# Patient Record
Sex: Female | Born: 1980 | Race: Black or African American | Hispanic: No | Marital: Single | Smoking: Former smoker
Health system: Southern US, Community
[De-identification: ages and names within clinical notes are randomized; demographics above are authoritative.]

## PROBLEM LIST (undated history)

## (undated) ENCOUNTER — Inpatient Hospital Stay (HOSPITAL_COMMUNITY): Payer: Self-pay

## (undated) DIAGNOSIS — B373 Candidiasis of vulva and vagina: Secondary | ICD-10-CM

## (undated) DIAGNOSIS — B9689 Other specified bacterial agents as the cause of diseases classified elsewhere: Secondary | ICD-10-CM

## (undated) DIAGNOSIS — N760 Acute vaginitis: Secondary | ICD-10-CM

## (undated) DIAGNOSIS — M797 Fibromyalgia: Secondary | ICD-10-CM

## (undated) DIAGNOSIS — B3731 Acute candidiasis of vulva and vagina: Secondary | ICD-10-CM

## (undated) DIAGNOSIS — E876 Hypokalemia: Secondary | ICD-10-CM

## (undated) DIAGNOSIS — D649 Anemia, unspecified: Secondary | ICD-10-CM

## (undated) DIAGNOSIS — O24419 Gestational diabetes mellitus in pregnancy, unspecified control: Secondary | ICD-10-CM

## (undated) DIAGNOSIS — N390 Urinary tract infection, site not specified: Secondary | ICD-10-CM

## (undated) DIAGNOSIS — I10 Essential (primary) hypertension: Secondary | ICD-10-CM

## (undated) HISTORY — DX: Fibromyalgia: M79.7

## (undated) HISTORY — DX: Essential (primary) hypertension: I10

## (undated) HISTORY — PX: TONSILECTOMY, ADENOIDECTOMY, BILATERAL MYRINGOTOMY AND TUBES: SHX2538

## (undated) HISTORY — PX: CHOLECYSTECTOMY: SHX55

## (undated) HISTORY — DX: Anemia, unspecified: D64.9

---

## 2009-02-08 ENCOUNTER — Ambulatory Visit: Payer: Self-pay | Admitting: Advanced Practice Midwife

## 2009-02-08 ENCOUNTER — Inpatient Hospital Stay (HOSPITAL_COMMUNITY): Admission: AD | Admit: 2009-02-08 | Discharge: 2009-02-08 | Payer: Self-pay | Admitting: Family Medicine

## 2009-02-18 ENCOUNTER — Ambulatory Visit (HOSPITAL_COMMUNITY): Admission: RE | Admit: 2009-02-18 | Discharge: 2009-02-18 | Payer: Self-pay | Admitting: Family Medicine

## 2009-03-04 ENCOUNTER — Ambulatory Visit (HOSPITAL_COMMUNITY): Admission: RE | Admit: 2009-03-04 | Discharge: 2009-03-04 | Payer: Self-pay | Admitting: Obstetrics & Gynecology

## 2009-03-15 ENCOUNTER — Inpatient Hospital Stay (HOSPITAL_COMMUNITY): Admission: AD | Admit: 2009-03-15 | Discharge: 2009-03-15 | Payer: Self-pay | Admitting: Family Medicine

## 2009-03-19 ENCOUNTER — Inpatient Hospital Stay (HOSPITAL_COMMUNITY): Admission: AD | Admit: 2009-03-19 | Discharge: 2009-03-19 | Payer: Self-pay | Admitting: Obstetrics & Gynecology

## 2009-03-22 ENCOUNTER — Inpatient Hospital Stay (HOSPITAL_COMMUNITY): Admission: AD | Admit: 2009-03-22 | Discharge: 2009-03-22 | Payer: Self-pay | Admitting: Obstetrics & Gynecology

## 2009-03-23 ENCOUNTER — Ambulatory Visit: Payer: Self-pay | Admitting: Family Medicine

## 2009-03-23 ENCOUNTER — Inpatient Hospital Stay (HOSPITAL_COMMUNITY): Admission: AD | Admit: 2009-03-23 | Discharge: 2009-03-25 | Payer: Self-pay | Admitting: Obstetrics and Gynecology

## 2009-03-28 ENCOUNTER — Inpatient Hospital Stay (HOSPITAL_COMMUNITY): Admission: AD | Admit: 2009-03-28 | Discharge: 2009-03-28 | Payer: Self-pay | Admitting: Obstetrics & Gynecology

## 2009-03-28 ENCOUNTER — Ambulatory Visit: Payer: Self-pay | Admitting: Vascular Surgery

## 2009-03-28 ENCOUNTER — Encounter: Payer: Self-pay | Admitting: Family Medicine

## 2009-08-26 ENCOUNTER — Emergency Department (HOSPITAL_COMMUNITY): Admission: EM | Admit: 2009-08-26 | Discharge: 2009-08-26 | Payer: Self-pay | Admitting: Family Medicine

## 2009-12-02 ENCOUNTER — Emergency Department (HOSPITAL_COMMUNITY): Admission: EM | Admit: 2009-12-02 | Discharge: 2009-12-02 | Payer: Self-pay | Admitting: Family Medicine

## 2009-12-17 ENCOUNTER — Emergency Department (HOSPITAL_BASED_OUTPATIENT_CLINIC_OR_DEPARTMENT_OTHER): Admission: EM | Admit: 2009-12-17 | Discharge: 2009-12-17 | Payer: Self-pay | Admitting: Emergency Medicine

## 2010-06-01 ENCOUNTER — Emergency Department (HOSPITAL_COMMUNITY)
Admission: EM | Admit: 2010-06-01 | Discharge: 2010-06-01 | Payer: Self-pay | Source: Home / Self Care | Admitting: Emergency Medicine

## 2010-06-02 LAB — POCT URINALYSIS DIPSTICK
Hgb urine dipstick: NEGATIVE
Ketones, ur: NEGATIVE mg/dL
Nitrite: NEGATIVE
Protein, ur: NEGATIVE mg/dL
Urobilinogen, UA: 1 mg/dL (ref 0.0–1.0)

## 2010-06-02 LAB — URINALYSIS, MICROSCOPIC ONLY
Ketones, ur: NEGATIVE mg/dL
Protein, ur: NEGATIVE mg/dL
Urine Glucose, Fasting: NEGATIVE mg/dL
Urobilinogen, UA: 1 mg/dL (ref 0.0–1.0)

## 2010-06-02 LAB — POCT PREGNANCY, URINE: Preg Test, Ur: NEGATIVE

## 2010-06-03 LAB — URINE CULTURE: Colony Count: NO GROWTH

## 2010-07-09 ENCOUNTER — Inpatient Hospital Stay (INDEPENDENT_AMBULATORY_CARE_PROVIDER_SITE_OTHER)
Admission: RE | Admit: 2010-07-09 | Discharge: 2010-07-09 | Disposition: A | Discharge status: Home / Self Care | Payer: Self-pay | Source: Ambulatory Visit | Attending: Family Medicine | Admitting: Family Medicine

## 2010-07-09 DIAGNOSIS — M549 Dorsalgia, unspecified: Secondary | ICD-10-CM

## 2010-07-09 DIAGNOSIS — N949 Unspecified condition associated with female genital organs and menstrual cycle: Secondary | ICD-10-CM

## 2010-07-09 LAB — POCT PREGNANCY, URINE: Preg Test, Ur: NEGATIVE

## 2010-07-09 LAB — POCT URINALYSIS DIPSTICK
Nitrite: NEGATIVE
Specific Gravity, Urine: 1.025 (ref 1.005–1.030)

## 2010-07-09 LAB — WET PREP, GENITAL
Trich, Wet Prep: NONE SEEN
WBC, Wet Prep HPF POC: NONE SEEN

## 2010-08-12 LAB — CBC
HCT: 24.9 % — ABNORMAL LOW (ref 36.0–46.0)
HCT: 31.4 % — ABNORMAL LOW (ref 36.0–46.0)
Hemoglobin: 8.3 g/dL — ABNORMAL LOW (ref 12.0–15.0)
Hemoglobin: 10.4 g/dL — ABNORMAL LOW (ref 12.0–15.0)
MCHC: 33.2 g/dL (ref 30.0–36.0)
MCHC: 33.3 g/dL (ref 30.0–36.0)
MCV: 81.3 fL (ref 78.0–100.0)
MCV: 81.1 fL (ref 78.0–100.0)
Platelets: 224 10*3/uL (ref 150–400)
Platelets: 244 10*3/uL (ref 150–400)
RBC: 3.87 MIL/uL (ref 3.87–5.11)
RDW: 14 % (ref 11.5–15.5)
RDW: 13.9 % (ref 11.5–15.5)
WBC: 5.6 10*3/uL (ref 4.0–10.5)

## 2010-08-12 LAB — DIFFERENTIAL
Basophils Absolute: 0 10*3/uL (ref 0.0–0.1)
Basophils Relative: 0 % (ref 0–1)
Lymphocytes Relative: 23 % (ref 12–46)
Monocytes Absolute: 0.5 10*3/uL (ref 0.1–1.0)
Neutro Abs: 3.7 10*3/uL (ref 1.7–7.7)
Neutrophils Relative %: 67 % (ref 43–77)

## 2010-08-12 LAB — RAPID HIV SCREEN (WH-MAU): Rapid HIV Screen: NONREACTIVE

## 2010-08-12 LAB — TYPE AND SCREEN

## 2010-09-23 ENCOUNTER — Inpatient Hospital Stay (INDEPENDENT_AMBULATORY_CARE_PROVIDER_SITE_OTHER)
Admission: RE | Admit: 2010-09-23 | Discharge: 2010-09-23 | Disposition: A | Discharge status: Home / Self Care | Payer: Self-pay | Source: Ambulatory Visit | Attending: Family Medicine | Admitting: Family Medicine

## 2010-09-23 ENCOUNTER — Ambulatory Visit (INDEPENDENT_AMBULATORY_CARE_PROVIDER_SITE_OTHER): Payer: Self-pay

## 2010-09-23 DIAGNOSIS — W19XXXA Unspecified fall, initial encounter: Secondary | ICD-10-CM

## 2010-09-23 DIAGNOSIS — IMO0002 Reserved for concepts with insufficient information to code with codable children: Secondary | ICD-10-CM

## 2010-09-23 LAB — POCT URINALYSIS DIP (DEVICE)
Nitrite: NEGATIVE
Protein, ur: NEGATIVE mg/dL
Urobilinogen, UA: 0.2 mg/dL (ref 0.0–1.0)

## 2010-09-23 LAB — GLUCOSE, CAPILLARY: Glucose-Capillary: 78 mg/dL (ref 70–99)

## 2010-09-23 LAB — POCT PREGNANCY, URINE: Preg Test, Ur: NEGATIVE

## 2010-10-19 ENCOUNTER — Encounter: Payer: Self-pay | Admitting: Family Medicine

## 2010-10-19 ENCOUNTER — Ambulatory Visit (INDEPENDENT_AMBULATORY_CARE_PROVIDER_SITE_OTHER): Payer: Self-pay | Admitting: Family Medicine

## 2010-10-19 DIAGNOSIS — N926 Irregular menstruation, unspecified: Secondary | ICD-10-CM | POA: Insufficient documentation

## 2010-10-19 DIAGNOSIS — G4700 Insomnia, unspecified: Secondary | ICD-10-CM

## 2010-10-19 DIAGNOSIS — R5383 Other fatigue: Secondary | ICD-10-CM | POA: Insufficient documentation

## 2010-10-19 DIAGNOSIS — E669 Obesity, unspecified: Secondary | ICD-10-CM | POA: Insufficient documentation

## 2010-10-19 LAB — CBC
MCV: 72.7 fL — ABNORMAL LOW (ref 78.0–100.0)
Platelets: 326 10*3/uL (ref 150–400)
RBC: 4.69 MIL/uL (ref 3.87–5.11)
RDW: 16.5 % — ABNORMAL HIGH (ref 11.5–15.5)
WBC: 6.8 10*3/uL (ref 4.0–10.5)

## 2010-10-19 LAB — TSH: TSH: 2.055 u[IU]/mL (ref 0.350–4.500)

## 2010-10-19 MED ORDER — ZOLPIDEM TARTRATE 10 MG PO TABS: 10.0000 mg | ORAL_TABLET | Freq: Every evening | ORAL | Status: DC | PRN

## 2010-10-19 NOTE — Assessment & Plan Note (Signed)
I am concerned for PCOS, especially considering pt's weight gain.  For now will make sure there is no underlying cause of her weight gain.

## 2010-10-19 NOTE — Assessment & Plan Note (Signed)
Pt obese and with consistent weight gain for past year.  I am concerned for an underlying cause, will check TSH.  Did encourage exercise to help with both weight management and sleep difficulty.

## 2010-10-19 NOTE — Assessment & Plan Note (Signed)
Will check CBC as well as TSH as pt. Has significant feelings of fatigue.

## 2010-10-19 NOTE — Progress Notes (Signed)
  Subjective:    Patient ID: JUDIA ARNOTT, female    DOB: 11-25-1980, 30 y.o.   MRN: [id number]  HPI Ms. Stockton presents to establish care.  She reports she has concerns about weight gain & fatigue over the past year, irregular menses, and difficulty sleeping.  She has no major medical problems except a history of anemia during pregnancy and gestational diabetes (diet controlled) during one of her three pregnancies.   She says over the past year she has gained about 40 lbs and she does not know why.  She is eating right, and was exercising regularly until last winter, but she has continued to gain weight.    She says that she takes care of her three children and for several months has felt constantly tired through the day.  She says she puts the children to bed by 8pm, watches 2 hours of tv, and then goes to sleep, but cannot fall asleep after feeling exhausted all day.  She has a regular bed time routine and does not fall asleep with the TV on.  Amelda has had irregular menses over the past few months.  She will miss a period, and then it will start again two months later.  She has had spotting in between but no excessive  Bleeding.  She is not currently sexually active.    Review of Systems Pertinent items noted in HPI.     Objective:   Physical Exam BP 125/90  Pulse 85  Temp 98.7 F (37.1 C)  Ht 5' 3.5" (1.613 m)  Wt 251 lb (113.853 kg)  BMI 43.77 kg/m2  LMP 09/23/2010 General appearance: alert, cooperative, no distress and moderately obese Throat: lips, mucosa, and tongue normal; teeth and gums normal Neck: no adenopathy, supple, symmetrical, trachea midline and thyroid not enlarged, symmetric, no tenderness/mass/nodules Lungs: clear to auscultation bilaterally Heart: regular rate and rhythm, S1, S2 normal, no murmur, click, rub or gallop Abdomen: soft, non-tender; bowel sounds normal; no masses,  no organomegaly Extremities: extremities normal, atraumatic, no  cyanosis or edema Pulses: 2+ and symmetric Skin: Skin color, texture, turgor normal. No rashes or lesions       Assessment & Plan:

## 2010-10-19 NOTE — Assessment & Plan Note (Addendum)
Will Rx Ambien.  I feel this is likely related to life stressors but medication may help pt re-establish a normal sleep cycle. Encouraged normal bedtime routine and regular exercise.

## 2010-10-19 NOTE — Patient Instructions (Signed)
It was good to meet you.  I will send you a letter with you lab results.  I want you to try to incorporate exercise into your daily routine, I think this will help with your energy level and your sleep at night.  Please come back in 2-3 months for a well woman exam (pap smear), and to follow up on your current concerns.

## 2010-10-29 ENCOUNTER — Telehealth: Payer: Self-pay | Admitting: Family Medicine

## 2010-10-29 DIAGNOSIS — D649 Anemia, unspecified: Secondary | ICD-10-CM | POA: Insufficient documentation

## 2010-10-29 MED ORDER — FERROUS SULFATE 325 (65 FE) MG PO TABS: 325.0000 mg | ORAL_TABLET | Freq: Two times a day (BID) | ORAL | Status: DC

## 2010-10-29 NOTE — Telephone Encounter (Signed)
Called patient to notify her about her lab values.  Let her know her TSH was normal but hemoglobin mildly low.  She has had Iron deficiency anemia in past, this lab report shows microcytic anemia.  Will stat iron today.  Patient agrees.

## 2011-07-12 ENCOUNTER — Emergency Department (INDEPENDENT_AMBULATORY_CARE_PROVIDER_SITE_OTHER)
Admission: EM | Admit: 2011-07-12 | Discharge: 2011-07-12 | Disposition: A | Discharge status: Home Health | Payer: Self-pay | Source: Home / Self Care | Attending: Emergency Medicine | Admitting: Emergency Medicine

## 2011-07-12 ENCOUNTER — Telehealth (HOSPITAL_COMMUNITY): Payer: Self-pay | Admitting: *Deleted

## 2011-07-12 ENCOUNTER — Encounter (HOSPITAL_COMMUNITY): Payer: Self-pay

## 2011-07-12 DIAGNOSIS — K5289 Other specified noninfective gastroenteritis and colitis: Secondary | ICD-10-CM

## 2011-07-12 DIAGNOSIS — K529 Noninfective gastroenteritis and colitis, unspecified: Secondary | ICD-10-CM

## 2011-07-12 LAB — POCT I-STAT, CHEM 8
BUN: 4 mg/dL — ABNORMAL LOW (ref 6–23)
Calcium, Ion: 1.19 mmol/L (ref 1.12–1.32)
TCO2: 25 mmol/L (ref 0–100)

## 2011-07-12 LAB — POCT URINALYSIS DIP (DEVICE)
Glucose, UA: NEGATIVE mg/dL
Leukocytes, UA: NEGATIVE
Specific Gravity, Urine: >=1.03 (ref 1.005–1.030)

## 2011-07-12 LAB — DIFFERENTIAL
Basophils Relative: 0 % (ref 0–1)
Lymphocytes Relative: 23 % (ref 12–46)
Lymphs Abs: 1 10*3/uL (ref 0.7–4.0)
Monocytes Relative: 4 % (ref 3–12)
Neutro Abs: 3.1 10*3/uL (ref 1.7–7.7)
Neutrophils Relative %: 71 % (ref 43–77)

## 2011-07-12 LAB — CBC
Hemoglobin: 11.2 g/dL — ABNORMAL LOW (ref 12.0–15.0)
RBC: 4.6 MIL/uL (ref 3.87–5.11)
WBC: 4.4 10*3/uL (ref 4.0–10.5)

## 2011-07-12 LAB — POCT PREGNANCY, URINE: Preg Test, Ur: NEGATIVE

## 2011-07-12 MED ORDER — GI COCKTAIL ~~LOC~~
30.0000 mL | Freq: Once | ORAL | Status: AC
  Administered 2011-07-12: 30 mL via ORAL

## 2011-07-12 MED ORDER — GI COCKTAIL ~~LOC~~
ORAL | Status: AC
  Filled 2011-07-12: qty 30

## 2011-07-12 MED ORDER — ONDANSETRON 4 MG PO TBDP
8.0000 mg | ORAL_TABLET | Freq: Once | ORAL | Status: AC
Start: 2011-07-12 — End: 2011-07-12
  Administered 2011-07-12: 8 mg via ORAL

## 2011-07-12 MED ORDER — BELLADONNA ALK-PHENOBARBITAL 16.2 MG/5ML PO ELIX: 10.0000 mL | ORAL_SOLUTION | Freq: Four times a day (QID) | ORAL | Status: DC | PRN

## 2011-07-12 MED ORDER — ONDANSETRON 4 MG PO TBDP
ORAL_TABLET | ORAL | Status: AC
  Filled 2011-07-12: qty 2

## 2011-07-12 MED ORDER — DIPHENOXYLATE-ATROPINE 2.5-0.025 MG PO TABS: 1.0000 | ORAL_TABLET | Freq: Four times a day (QID) | ORAL | Status: AC | PRN

## 2011-07-12 MED ORDER — ONDANSETRON 8 MG PO TBDP: 8.0000 mg | ORAL_TABLET | Freq: Three times a day (TID) | ORAL | Status: AC | PRN

## 2011-07-12 NOTE — Discharge Instructions (Signed)

## 2011-07-12 NOTE — ED Notes (Signed)
Pt states she ate out Saturday night and wasn't feeling well so she took a laxative.  Reports yesterday she developed diarrhea and upper abdominal cramping.  Today she has vomited x 2 and is feeling tired and sleepy.  Reports she was up several times during the night.

## 2011-07-12 NOTE — ED Notes (Signed)
Pt. called and said she only got one Rx. and was supposed to get 3. I explained that the other 2 were probably E- prescribed to her preferred pharmacy. I told her the other one can't be e-prescribed and that is why she was given a paper Rx. I told her to take it to [facility] on [address] and get it filled. If the other two are not there to call back. Sherry Jimenez 07/12/2011

## 2011-07-12 NOTE — ED Provider Notes (Signed)
Chief Complaint  Patient presents with  . Abdominal Pain  . Diarrhea  . Emesis    History of Present Illness:   The patient is a 31 year old female who has had a three-day history of bilateral upper abdominal pain radiating through the back. This came on Saturday evening after eating a fairly large meal at a buffet. The pain is rated 9-10 over 10 in intensity, moves around, and is sharp. The pain became worse after she took some Tylenol also after eating or drinking. There is nothing particular that relieves the pain. She's had nausea and vomiting twice, chills, and 6-7 diarrheal stools per day. There's been no blood in the vomitus or the stool and no melena or coffee-ground emesis. She's on her menses right now. She is status post cholecystectomy. She denies any fever. No history of ulcer disease. She also complains of some numbness in her feet that tends to occur in the evening and gets better she elevates her legs. This is been going on since December.  Review of Systems:  Other than noted above, the patient denies any of the following symptoms: Constitutional:  No fever, chills, fatigue, weight loss or anorexia. Lungs:  No cough or shortness of breath. Heart:  No chest pain, palpitations, syncope or edema. Abdomen:  No nausea, vomiting, hematememesis, melena, diarrhea, or hematochezia. GU:  No dysuria, frequency, urgency, or hematuria. Gyn:  No vaginal discharge, itching, abnormal bleeding or pelvic pain. Skin:  No rash or itching.  PMFSH:  Past medical history, family history, social history, meds, and allergies were reviewed.  Physical Exam:   Vital signs:  BP 112/87  Pulse 90  Temp(Src) 98.8 F (37.1 C) (Oral)  Resp 18  SpO2 100%  LMP 07/10/2011 Gen:  Alert, oriented, in no distress. Lungs:  Breath sounds clear and equal bilaterally.  No wheezes, rales or rhonchi. Heart:  Regular rhythm.  No gallops or murmers.   Abdomen:  Abdomen is soft, flat, and nondistended. Bowel sounds  are hyperactive. No organomegaly or mass. She has mild tenderness to palpation in the upper abdomen but none in the lower abdomen. Skin:  Clear, warm and dry.  No rash.  Labs:   Results for orders placed during the hospital encounter of 07/12/11  POCT URINALYSIS DIP (DEVICE)      Component Value Range   Glucose, UA NEGATIVE  NEGATIVE (mg/dL)   Bilirubin Urine SMALL (*) NEGATIVE    Ketones, ur TRACE (*) NEGATIVE (mg/dL)   Specific Gravity, Urine >=1.030  1.005 - 1.030    Hgb urine dipstick LARGE (*) NEGATIVE    pH 5.5  5.0 - 8.0    Protein, ur 100 (*) NEGATIVE (mg/dL)   Urobilinogen, UA 2.0 (*) 0.0 - 1.0 (mg/dL)   Nitrite POSITIVE (*) NEGATIVE    Leukocytes, UA NEGATIVE  NEGATIVE   POCT PREGNANCY, URINE      Component Value Range   Preg Test, Ur NEGATIVE  NEGATIVE   CBC      Component Value Range   WBC 4.4  4.0 - 10.5 (K/uL)   RBC 4.60  3.87 - 5.11 (MIL/uL)   Hemoglobin 11.2 (*) 12.0 - 15.0 (g/dL)   HCT 16.1 (*) 09.6 - 46.0 (%)   MCV 75.0 (*) 78.0 - 100.0 (fL)   MCH 24.3 (*) 26.0 - 34.0 (pg)   MCHC 32.5  30.0 - 36.0 (g/dL)   RDW 04.5 (*) 40.9 - 15.5 (%)   Platelets 214  150 - 400 (K/uL)  DIFFERENTIAL  Component Value Range   Neutrophils Relative 71  43 - 77 (%)   Neutro Abs 3.1  1.7 - 7.7 (K/uL)   Lymphocytes Relative 23  12 - 46 (%)   Lymphs Abs 1.0  0.7 - 4.0 (K/uL)   Monocytes Relative 4  3 - 12 (%)   Monocytes Absolute 0.2  0.1 - 1.0 (K/uL)   Eosinophils Relative 2  0 - 5 (%)   Eosinophils Absolute 0.1  0.0 - 0.7 (K/uL)   Basophils Relative 0  0 - 1 (%)   Basophils Absolute 0.0  0.0 - 0.1 (K/uL)  POCT I-STAT, CHEM 8      Component Value Range   Sodium 141  135 - 145 (mEq/L)   Potassium 3.6  3.5 - 5.1 (mEq/L)   Chloride 105  96 - 112 (mEq/L)   BUN 4 (*) 6 - 23 (mg/dL)   Creatinine, Ser 1.61  0.50 - 1.10 (mg/dL)   Glucose, Bld 096 (*) 70 - 99 (mg/dL)   Calcium, Ion 0.45  4.09 - 1.32 (mmol/L)   TCO2 25  0 - 100 (mmol/L)   Hemoglobin 12.6  12.0 - 15.0 (g/dL)    HCT 81.1  91.4 - 78.2 (%)     Course in Urgent Care Center:   A urine culture was obtained, but will hold off on treatment since she has no urinary symptoms at all. She was given Zofran ODT 8 mg sublingually and a dose of GI cocktail with partial relief of her symptoms.   Assessment:   Diagnoses that have been ruled out:  None  Diagnoses that are still under consideration:  None  Final diagnoses:  Gastroenteritis    Plan:   1.  The following meds were prescribed:   New Prescriptions   BELLADONNA-PHENOBARBITAL (DONNATAL) 16.2 MG/5ML ELIX    Take 10 mLs (32.4 mg total) by mouth 4 (four) times daily as needed for cramping.   DIPHENOXYLATE-ATROPINE (LOMOTIL) 2.5-0.025 MG PER TABLET    Take 1 tablet by mouth 4 (four) times daily as needed for diarrhea or loose stools.   ONDANSETRON (ZOFRAN ODT) 8 MG DISINTEGRATING TABLET    Take 1 tablet (8 mg total) by mouth every 8 (eight) hours as needed for nausea.   2.  The patient was instructed in symptomatic care and handouts were given. 3.  The patient was told to return if becoming worse in any way, if no better in 3 or 4 days, and given some red flag symptoms that would indicate earlier return.    Roque Lias, MD 07/12/11 1224

## 2011-07-13 LAB — URINE CULTURE
Culture  Setup Time: 201303041215
Culture: NO GROWTH
Special Requests: NORMAL

## 2011-09-07 ENCOUNTER — Emergency Department (INDEPENDENT_AMBULATORY_CARE_PROVIDER_SITE_OTHER)
Admission: EM | Admit: 2011-09-07 | Discharge: 2011-09-07 | Disposition: A | Discharge status: Home / Self Care | Payer: Self-pay | Source: Home / Self Care | Attending: Emergency Medicine | Admitting: Emergency Medicine

## 2011-09-07 ENCOUNTER — Encounter (HOSPITAL_COMMUNITY): Payer: Self-pay | Admitting: *Deleted

## 2011-09-07 DIAGNOSIS — R300 Dysuria: Secondary | ICD-10-CM

## 2011-09-07 DIAGNOSIS — N3090 Cystitis, unspecified without hematuria: Secondary | ICD-10-CM

## 2011-09-07 HISTORY — DX: Gestational diabetes mellitus in pregnancy, unspecified control: O24.419

## 2011-09-07 HISTORY — DX: Acute candidiasis of vulva and vagina: B37.31

## 2011-09-07 HISTORY — DX: Other specified bacterial agents as the cause of diseases classified elsewhere: B96.89

## 2011-09-07 HISTORY — DX: Urinary tract infection, site not specified: N39.0

## 2011-09-07 HISTORY — DX: Acute vaginitis: N76.0

## 2011-09-07 HISTORY — DX: Candidiasis of vulva and vagina: B37.3

## 2011-09-07 LAB — POCT URINALYSIS DIP (DEVICE)
Glucose, UA: NEGATIVE mg/dL
Hgb urine dipstick: NEGATIVE
Nitrite: NEGATIVE
Urobilinogen, UA: 1 mg/dL (ref 0.0–1.0)

## 2011-09-07 MED ORDER — PHENAZOPYRIDINE HCL 200 MG PO TABS: 200.0000 mg | ORAL_TABLET | Freq: Three times a day (TID) | ORAL | Status: AC | PRN

## 2011-09-07 NOTE — ED Provider Notes (Signed)
History     CSN: [id number]  Arrival date & time 09/07/11  1449   First MD Initiated Contact with Patient 09/07/11 1601      Chief Complaint  Patient presents with  . Urinary Tract Infection    (Consider location/radiation/quality/duration/timing/severity/associated sxs/prior treatment) HPI Comments: Patient reports urinary urgency, frequency, dysuria urine that is "darker than usual" for the past week. Intermittent, nonradiating, crampy lower abdominal and flank pain but patient states feels similar to previous UTIs. She has quit drinking sodas, and increased her fluid intake and taking Tylenol with mild relief. No aggravating factors. No oderous or cloudy urine. No hematuria. No vaginal bleeding, vaginal discharge. No vaginal rash, vaginal pain. No nausea, vomiting, fevers, back pain. Patient denies any facial or lower extremity swelling. She is sexually active with a long-term female partner for over 2 years, who is asymptomatic. STDs not a concern today. Patient states that her period is one week late, but has taken multiple pregnancy tests at home which have been negative. Patient has a history of UTIs, BV, vaginal yeast infections. No history of kidney stones, pyelonephritis, nephrotic syndrome, trichomoniasis, gonorrhea, Chlamydia, herpes, syphilis, HIV.   ROS as noted in HPI. All other ROS negative.   Patient is a 31 y.o. female presenting with dysuria. The history is provided by the patient. No language interpreter was used.  Dysuria  This is a new problem. The current episode started more than 2 days ago. The problem occurs every urination. The problem has not changed since onset.The quality of the pain is described as burning. There has been no fever. She is sexually active. There is no history of pyelonephritis. Associated symptoms include frequency, hesitancy, possible pregnancy, urgency and flank pain. Pertinent negatives include no nausea, no vomiting, no discharge and no  hematuria. She has tried acetaminophen and increased fluids for the symptoms. Her past medical history does not include kidney stones or recurrent UTIs.    Past Medical History  Diagnosis Date  . Anemia   . Gestational diabetes   . UTI (lower urinary tract infection)   . BV (bacterial vaginosis)   . Cholelithiasis   . Vaginal yeast infection     Past Surgical History  Procedure Date  . Cholecystectomy   . Tonsilectomy, adenoidectomy, bilateral myringotomy and tubes     Family History  Problem Relation Age of Onset  . Diabetes Father   . Alcohol abuse Father     History  Substance Use Topics  . Smoking status: Current Some Day Smoker -- 0.2 packs/day for 12 years  . Smokeless tobacco: Not on file   Comment: smokes when very stressed out  . Alcohol Use: Yes    OB History    Grav Para Term Preterm Abortions TAB SAB Ect Mult Living                  Review of Systems  Gastrointestinal: Negative for nausea and vomiting.  Genitourinary: Positive for dysuria, hesitancy, urgency, frequency and flank pain. Negative for hematuria.    Allergies  Review of patient's allergies indicates no known allergies.  Home Medications   Current Outpatient Rx  Name Route Sig Dispense Refill  . PHENAZOPYRIDINE HCL 200 MG PO TABS Oral Take 1 tablet (200 mg total) by mouth 3 (three) times daily as needed for pain. 6 tablet 0    BP 157/96  Pulse 78  Temp(Src) 97.9 F (36.6 C) (Oral)  Resp 18  SpO2 100%  LMP 08/04/2011  Physical Exam  Nursing note and vitals reviewed. Constitutional: She is oriented to person, place, and time. She appears well-developed and well-nourished.  HENT:  Head: Normocephalic and atraumatic.  Eyes: Conjunctivae and EOM are normal.  Neck: Normal range of motion.  Cardiovascular: Normal rate, regular rhythm, normal heart sounds and intact distal pulses.   No murmur heard. Pulmonary/Chest: Effort normal and breath sounds normal.  Abdominal: Soft. Bowel  sounds are normal. She exhibits no distension. There is tenderness in the suprapubic area. There is no rebound, no guarding and no CVA tenderness.  Musculoskeletal: Normal range of motion. She exhibits no edema.  Neurological: She is alert and oriented to person, place, and time.  Skin: Skin is warm and dry.  Psychiatric: She has a normal mood and affect. Her behavior is normal. Judgment and thought content normal.    ED Course  Procedures (including critical care time)   Labs Reviewed  POCT URINALYSIS DIP (DEVICE)  POCT PREGNANCY, URINE  URINE CULTURE   No results found.   1. Cystitis   2. Dysuria     Results for orders placed during the hospital encounter of 09/07/11  POCT URINALYSIS DIP (DEVICE)      Component Value Range   Glucose, UA NEGATIVE  NEGATIVE (mg/dL)   Bilirubin Urine NEGATIVE  NEGATIVE    Ketones, ur NEGATIVE  NEGATIVE (mg/dL)   Specific Gravity, Urine 1.025  1.005 - 1.030    Hgb urine dipstick NEGATIVE  NEGATIVE    pH 6.5  5.0 - 8.0    Protein, ur NEGATIVE  NEGATIVE (mg/dL)   Urobilinogen, UA 1.0  0.0 - 1.0 (mg/dL)   Nitrite NEGATIVE  NEGATIVE    Leukocytes, UA NEGATIVE  NEGATIVE   POCT PREGNANCY, URINE      Component Value Range   Preg Test, Ur NEGATIVE  NEGATIVE     MDM  Previous records, labs reviewed. Patient was seen on 3/4 for bilateral upper abdominal pain that radiated through to her back. Patient was found to have positive nitrites on udip on 3/4. Urine culture was sent, but no treatment initiated, as patient was asymptomatic at the time. Urine culture negative for infection. I-STAT, CBC were normal. She she was thought to have gastroenteritis, sent home with Zofran, Lomotil, and Donnatal. Patient states these symptoms have resolved.  Patient denies possibility of GYN infection. Will send patient's urine off for culture, as udip negative for infection. Treating symptomatically with pyridium. BP noted she is otherwise asxatic. She will have this  rechecked at PMD's. Patient is to give [place] a working phone number so that we can contact her and call in the appropriate antibiotic if necessary. No blood or protein in the urine, suggesting nephrotic syndrome. No hematuria, suggesting kidney stones. Patient's is to f/u w/ family practice center when necessary. Patient agrees with plan.  Luiz Blare, MD 09/07/11 [phone]

## 2011-09-07 NOTE — ED Notes (Signed)
Pt    Reports  Pain r  Flank  And  Side  X  1  Week       Also  Reports  Frequent  Urination        She  Reports  The  Color  Of  The  Urine  Appears  Somewhat  Darker than  Usual    Pt  denys  Any specefic injury        She  Walks  Upright with a  Slow  Steady  Gait

## 2011-09-07 NOTE — Discharge Instructions (Signed)
Take the medication as written. Drink extra fluids. Give [place] a working phone number so that we can contact you if needed. Refrain from sexual contact until you know your results and your partner(s) are treated if needed. Return if you get worse, have a fever >100.4, or for any concerns.   Go to www.goodrx.com to look up your medications. This will give you a list of where you can find your prescriptions at the most affordable prices.

## 2011-09-08 LAB — URINE CULTURE: Culture: NO GROWTH

## 2011-10-27 ENCOUNTER — Emergency Department (HOSPITAL_BASED_OUTPATIENT_CLINIC_OR_DEPARTMENT_OTHER)
Admission: EM | Admit: 2011-10-27 | Discharge: 2011-10-27 | Disposition: A | Discharge status: Home / Self Care | Payer: Self-pay | Attending: Emergency Medicine | Admitting: Emergency Medicine

## 2011-10-27 ENCOUNTER — Encounter (HOSPITAL_BASED_OUTPATIENT_CLINIC_OR_DEPARTMENT_OTHER): Payer: Self-pay

## 2011-10-27 DIAGNOSIS — K089 Disorder of teeth and supporting structures, unspecified: Secondary | ICD-10-CM | POA: Insufficient documentation

## 2011-10-27 DIAGNOSIS — K0889 Other specified disorders of teeth and supporting structures: Secondary | ICD-10-CM

## 2011-10-27 DIAGNOSIS — F17200 Nicotine dependence, unspecified, uncomplicated: Secondary | ICD-10-CM | POA: Insufficient documentation

## 2011-10-27 MED ORDER — IBUPROFEN 600 MG PO TABS: 600.0000 mg | ORAL_TABLET | Freq: Four times a day (QID) | ORAL | Status: AC | PRN

## 2011-10-27 MED ORDER — OXYCODONE-ACETAMINOPHEN 5-325 MG PO TABS: 1.0000 | ORAL_TABLET | Freq: Four times a day (QID) | ORAL | Status: AC | PRN

## 2011-10-27 MED ORDER — PENICILLIN V POTASSIUM 500 MG PO TABS: 500.0000 mg | ORAL_TABLET | Freq: Three times a day (TID) | ORAL | Status: AC

## 2011-10-27 NOTE — ED Notes (Signed)
Dental pain x 2 days

## 2011-10-27 NOTE — ED Provider Notes (Signed)
History     CSN: [id number]  Arrival date & time 10/27/11  1518   First MD Initiated Contact with Patient 10/27/11 1609      Chief Complaint  Patient presents with  . Dental Pain    (Consider location/radiation/quality/duration/timing/severity/associated sxs/prior treatment) HPI Pt presents with c/o left upper tooth pain.  States she broke a tooth while eating bread approx 2 weeks ago, then 2 nights ago awoke with sharp throbbing pain.  Has tried ibuprofen which initially seemed to help, but not today.  No fever/chills, no difficulty swallowing or breathing.  There are no other associated systemic symptoms.  Pains worse with palpation.  There are no other alleviating or modifying factors.   Past Medical History  Diagnosis Date  . Anemia   . Gestational diabetes   . UTI (lower urinary tract infection)   . BV (bacterial vaginosis)   . Cholelithiasis   . Vaginal yeast infection     Past Surgical History  Procedure Date  . Cholecystectomy   . Tonsilectomy, adenoidectomy, bilateral myringotomy and tubes     Family History  Problem Relation Age of Onset  . Diabetes Father   . Alcohol abuse Father     History  Substance Use Topics  . Smoking status: Current Some Day Smoker -- 0.2 packs/day for 12 years  . Smokeless tobacco: Not on file   Comment: smokes when very stressed out  . Alcohol Use: Yes     social    OB History    Grav Para Term Preterm Abortions TAB SAB Ect Mult Living                  Review of Systems ROS reviewed and all otherwise negative except for mentioned in HPI  Allergies  Review of patient's allergies indicates no known allergies.  Home Medications   Current Outpatient Rx  Name Route Sig Dispense Refill  . IBUPROFEN 200 MG PO TABS Oral Take 800 mg by mouth every 6 (six) hours as needed. Patient used this medication for pain.    . IBUPROFEN 600 MG PO TABS Oral Take 1 tablet (600 mg total) by mouth every 6 (six) hours as needed for pain. 30  tablet 0  . OXYCODONE-ACETAMINOPHEN 5-325 MG PO TABS Oral Take 1-2 tablets by mouth every 6 (six) hours as needed for pain. 15 tablet 0  . PENICILLIN V POTASSIUM 500 MG PO TABS Oral Take 1 tablet (500 mg total) by mouth 3 (three) times daily. 30 tablet 0    BP 174/108  Pulse 93  Temp 98.2 F (36.8 C) (Oral)  Resp 18  Ht 5\' 2"  (1.575 m)  Wt 235 lb (106.595 kg)  BMI 42.98 kg/m2  SpO2 100%  LMP 10/09/2011 Vitals reviewed Physical Exam Physical Examination: General appearance - alert, well appearing, and in no distress Mental status - alert, oriented to person, place, and time Eyes - pupils equal and reactive, no conjunctival injection Mouth - mucous membranes moist, pharynx normal without lesions, left upper premolar broken off down to gum line, no periapical abscess present, no swelling under tongue, no trismus Neck - supple, no significant adenopathy Chest - clear to auscultation, no wheezes, rales or rhonchi, symmetric air entry Heart - normal rate, regular rhythm, normal S1, S2, no murmurs, rubs, clicks or gallops Extremities - peripheral pulses normal, no pedal edema, no clubbing or cyanosis Skin - normal coloration and turgor, no rashes  ED Course  Procedures (including critical care time)  Labs Reviewed -  No data to display No results found.   1. Pain, dental       MDM  Pt presents with c/o dental pain x 2 days.  Has been taking ibuprofen- not much relief.  Pt without signs of obvious abscess, started on penicillin and pain meds.  Discharged with strict return precautions.  She is agreeable with this plan and is agreeable with working on getting a dental appointment which would be definitive care.          Ethelda Chick, MD 10/30/11 [phone]

## 2011-11-24 ENCOUNTER — Encounter (HOSPITAL_COMMUNITY): Payer: Self-pay

## 2011-11-24 ENCOUNTER — Emergency Department (HOSPITAL_COMMUNITY)
Admission: EM | Admit: 2011-11-24 | Discharge: 2011-11-24 | Disposition: A | Discharge status: Home / Self Care | Payer: Self-pay | Attending: Emergency Medicine | Admitting: Emergency Medicine

## 2011-11-24 ENCOUNTER — Emergency Department (HOSPITAL_COMMUNITY)
Admission: EM | Admit: 2011-11-24 | Discharge: 2011-11-24 | Disposition: A | Discharge status: Home / Self Care | Payer: Self-pay | Source: Home / Self Care

## 2011-11-24 DIAGNOSIS — K0889 Other specified disorders of teeth and supporting structures: Secondary | ICD-10-CM

## 2011-11-24 DIAGNOSIS — K089 Disorder of teeth and supporting structures, unspecified: Secondary | ICD-10-CM | POA: Insufficient documentation

## 2011-11-24 DIAGNOSIS — F17200 Nicotine dependence, unspecified, uncomplicated: Secondary | ICD-10-CM | POA: Insufficient documentation

## 2011-11-24 DIAGNOSIS — K029 Dental caries, unspecified: Secondary | ICD-10-CM

## 2011-11-24 DIAGNOSIS — J029 Acute pharyngitis, unspecified: Secondary | ICD-10-CM

## 2011-11-24 MED ORDER — PENICILLIN V POTASSIUM 500 MG PO TABS: 500.0000 mg | ORAL_TABLET | Freq: Three times a day (TID) | ORAL | Status: AC

## 2011-11-24 MED ORDER — IBUPROFEN 800 MG PO TABS: 800.0000 mg | ORAL_TABLET | Freq: Three times a day (TID) | ORAL | Status: AC

## 2011-11-24 MED ORDER — ONDANSETRON 8 MG PO TBDP
8.0000 mg | ORAL_TABLET | Freq: Once | ORAL | Status: AC
  Administered 2011-11-24: 8 mg via ORAL
  Filled 2011-11-24: qty 1

## 2011-11-24 MED ORDER — ACETAMINOPHEN-CODEINE 120-12 MG/5ML PO SOLN: ORAL | Status: DC

## 2011-11-24 MED ORDER — ONDANSETRON HCL 4 MG PO TABS: 4.0000 mg | ORAL_TABLET | Freq: Four times a day (QID) | ORAL | Status: AC

## 2011-11-24 MED ORDER — HYDROCODONE-ACETAMINOPHEN 7.5-500 MG/15ML PO SOLN
10.0000 mL | Freq: Once | ORAL | Status: AC
  Administered 2011-11-24: 15 mL via ORAL
  Filled 2011-11-24: qty 15

## 2011-11-24 NOTE — ED Provider Notes (Signed)
History     CSN: [id number]  Arrival date & time 11/24/11  1156   First MD Initiated Contact with Patient 11/24/11 1209      Chief Complaint  Patient presents with  . Sore Throat  . Dental Pain    (Consider location/radiation/quality/duration/timing/severity/associated sxs/prior treatment) HPI  Patient presents to emergency department complaining of a multiple week history of dental pain associated with her molars which have a massive dental decay as well as some gingival swelling. She's also complaining of gradual onset sore throat that began yesterday with painful swallowing as well as radiation of pain from her throat and her right ear. Patient states that sore throat is associated with nausea and has had 2 episodes of vomiting. Patient taken nothing for symptoms prior to arrival. Patient takes no medicines on regular basis. She denies fevers, chills, headache, neck stiffness, difficulty breathing, chest pain, shortness of breath, cough, abdominal pain. Dental pain has been waxing and waning but constant over the last 2 days. Sore throat was gradual onset, persistent, and unchanging. Dental pain sore throat is aggravated by chewing and swallowing. She denies alleviating factors. Patient does not have a dentist.   Past Medical History  Diagnosis Date  . Anemia   . Gestational diabetes   . UTI (lower urinary tract infection)   . BV (bacterial vaginosis)   . Cholelithiasis   . Vaginal yeast infection     Past Surgical History  Procedure Date  . Cholecystectomy   . Tonsilectomy, adenoidectomy, bilateral myringotomy and tubes     Family History  Problem Relation Age of Onset  . Diabetes Father   . Alcohol abuse Father     History  Substance Use Topics  . Smoking status: Current Some Day Smoker -- 0.2 packs/day for 12 years  . Smokeless tobacco: Not on file   Comment: smokes when very stressed out  . Alcohol Use: Yes     social    OB History    Grav Para Term Preterm  Abortions TAB SAB Ect Mult Living                  Review of Systems  All other systems reviewed and are negative.    Allergies  Review of patient's allergies indicates no known allergies.  Home Medications   Current Outpatient Rx  Name Route Sig Dispense Refill  . IBUPROFEN 200 MG PO TABS Oral Take 800 mg by mouth every 6 (six) hours as needed. Patient used this medication for pain.      BP 163/92  Pulse 107  Temp 99.1 F (37.3 C)  Resp 20  SpO2 99%  LMP 11/17/2011  Physical Exam  Nursing note and vitals reviewed. Constitutional: She is oriented to person, place, and time. She appears well-developed and well-nourished.       Non toxic appearing.   HENT:  Head: Normocephalic and atraumatic.  Right Ear: External ear normal.  Left Ear: External ear normal.       Massive dental decay of left upper second molar with TTP of surrounding gingiva but no gingival mass or fluctuance.   Erythema of posterior pharynx without tonsillar enlargement or exudate.   Patent airway. Uvula midline without edema.   Eyes: Conjunctivae are normal.  Neck: Normal range of motion. Neck supple.  Cardiovascular: Normal rate and regular rhythm.  Exam reveals no gallop and no friction rub.   No murmur heard. Pulmonary/Chest: Effort normal and breath sounds normal. No respiratory distress. She has no  wheezes. She has no rales. She exhibits no tenderness.  Abdominal: Soft. Bowel sounds are normal. She exhibits no distension and no mass. There is no tenderness. There is no rebound and no guarding.  Musculoskeletal: Normal range of motion.  Lymphadenopathy:    She has no cervical adenopathy.  Neurological: She is alert and oriented to person, place, and time.  Skin: Skin is warm and dry.  Psychiatric: She has a normal mood and affect. Her behavior is normal.    ED Course  Procedures (including critical care time)  PO lortab and ODT zofran.   Labs Reviewed - No data to display No results  found.   1. Pharyngitis   2. Dental decay   3. Pain, dental       MDM  Dental pain associated with dental cary but no signs or symptoms of dental abscess with patient afebrile, non toxic appearing and swallowing secretions well. I gave patient referral to dentist and stressed the importance of dental follow up for ultimate management of dental pain. Patient voices understanding and is agreeable to plan.  Will cover dental decay and gingivitis with PCN which would cover for strep however patient has patient airway without exudate and no signs or symptoms worrisome for tonsillar exudate.          [place], [place] 11/24/11 1323

## 2011-11-24 NOTE — ED Notes (Signed)
Patient reports that she has left facial swelling and has a broken tooth left upper jaw. Patient is also c/o right ear pain, nausea, vomiting.

## 2011-11-25 NOTE — ED Provider Notes (Signed)
Medical screening examination/treatment/procedure(s) were performed by non-physician practitioner and as supervising physician I was immediately available for consultation/collaboration.   Jailin Manocchio R Quadarius Henton, MD 11/25/11 0749 

## 2012-03-02 ENCOUNTER — Encounter: Payer: Self-pay | Admitting: Family Medicine

## 2012-03-02 ENCOUNTER — Ambulatory Visit (INDEPENDENT_AMBULATORY_CARE_PROVIDER_SITE_OTHER): Payer: BC Managed Care – PPO | Admitting: Family Medicine

## 2012-03-02 ENCOUNTER — Other Ambulatory Visit (HOSPITAL_COMMUNITY)
Admission: RE | Admit: 2012-03-02 | Discharge: 2012-03-02 | Disposition: A | Discharge status: Home / Self Care | Payer: BC Managed Care – PPO | Source: Ambulatory Visit | Attending: Family Medicine | Admitting: Family Medicine

## 2012-03-02 VITALS — BP 159/109 | HR 96 | Ht 62.0 in | Wt 224.0 lb

## 2012-03-02 DIAGNOSIS — R599 Enlarged lymph nodes, unspecified: Secondary | ICD-10-CM

## 2012-03-02 DIAGNOSIS — G4700 Insomnia, unspecified: Secondary | ICD-10-CM

## 2012-03-02 DIAGNOSIS — Z113 Encounter for screening for infections with a predominantly sexual mode of transmission: Secondary | ICD-10-CM | POA: Insufficient documentation

## 2012-03-02 DIAGNOSIS — Z1151 Encounter for screening for human papillomavirus (HPV): Secondary | ICD-10-CM | POA: Insufficient documentation

## 2012-03-02 DIAGNOSIS — Z7251 High risk heterosexual behavior: Secondary | ICD-10-CM

## 2012-03-02 DIAGNOSIS — Z124 Encounter for screening for malignant neoplasm of cervix: Secondary | ICD-10-CM

## 2012-03-02 DIAGNOSIS — E669 Obesity, unspecified: Secondary | ICD-10-CM

## 2012-03-02 DIAGNOSIS — Z01419 Encounter for gynecological examination (general) (routine) without abnormal findings: Secondary | ICD-10-CM | POA: Insufficient documentation

## 2012-03-02 DIAGNOSIS — R590 Localized enlarged lymph nodes: Secondary | ICD-10-CM | POA: Insufficient documentation

## 2012-03-02 LAB — CBC WITH DIFFERENTIAL/PLATELET
Hemoglobin: 10.6 g/dL — ABNORMAL LOW (ref 12.0–15.0)
Lymphocytes Relative: 36 % (ref 12–46)
Lymphs Abs: 2.8 10*3/uL (ref 0.7–4.0)
MCH: 23.6 pg — ABNORMAL LOW (ref 26.0–34.0)
Monocytes Relative: 6 % (ref 3–12)
Neutro Abs: 4 10*3/uL (ref 1.7–7.7)
Neutrophils Relative %: 52 % (ref 43–77)
RBC: 4.5 MIL/uL (ref 3.87–5.11)
WBC: 7.6 10*3/uL (ref 4.0–10.5)

## 2012-03-02 MED ORDER — ZOLPIDEM TARTRATE 10 MG PO TABS: 10.0000 mg | ORAL_TABLET | Freq: Every evening | ORAL | Status: DC | PRN

## 2012-03-02 NOTE — Assessment & Plan Note (Signed)
Concerning given length of presence of lymph node.  Will check CBC and HIV to look for abnormalities that may cause this.  Patient instructed to f/u in one month if node not getting smaller.

## 2012-03-02 NOTE — Assessment & Plan Note (Signed)
Pap and GC/Chlamydia done today. Patient declines flu shot.

## 2012-03-02 NOTE — Progress Notes (Signed)
  Subjective:    Patient ID: Sherry Jimenez, female    DOB: 07/14/1980, 31 y.o.   MRN: [id number]  HPI  Sherry Jimenez comes in for her yearly physical. She had her last pap in 2010.    She complains of a swollen node just below her left ear.  She says it is painful, and has been there several months, but does not think it has changed in size.  She denies fevers, chills, malaise.   Obesity- she has lost about 25 lbs in the past 18 months.  She says she is on a 1200 calorie/day diet, and has increased her exercise significantly.  She is excited about her weight loss, but says she has plateaued, and wants to know what she can do to continue losing weight.   She also complains of occasional insomnia. She has difficulty falling asleep about 2 times per week.  She has had Ambien in the past which helped.   Past Medical History  Diagnosis Date  . Anemia   . Gestational diabetes   . UTI (lower urinary tract infection)   . BV (bacterial vaginosis)   . Cholelithiasis   . Vaginal yeast infection    Family History  Problem Relation Age of Onset  . Diabetes Father   . Alcohol abuse Father    History  Substance Use Topics  . Smoking status: Current Some Day Smoker -- 0.2 packs/day for 12 years  . Smokeless tobacco: Not on file   Comment: smokes when very stressed out  . Alcohol Use: Yes     social     Review of Systems See HPI    Objective:   Physical Exam BP 159/109  Pulse 96  Ht 5\' 2"  (1.575 m)  Wt 224 lb (101.606 kg)  BMI 40.97 kg/m2 General appearance: alert, cooperative and no distress Head: Normocephalic, without obvious abnormality, atraumatic.   Lymph nodes: There is a 1.5 x1 cm palpable shotty lymph node just below patient's left ear.  It is tender, but no erythema, warmth, no fluctuance.  Neck, axilla, groin negative for other lymphadenopathy.  Neck: supple, symmetrical, trachea midline and thyroid not enlarged, symmetric, no tenderness/mass/nodules Lungs: clear to  auscultation bilaterally Heart: regular rate and rhythm, S1, S2 normal, no murmur, click, rub or gallop Pelvic: cervix normal in appearance, external genitalia normal, no adnexal masses or tenderness, no cervical motion tenderness, rectovaginal septum normal, uterus normal size, shape, and consistency and vagina normal without discharge Pulses: 2+ and symmetric        Assessment & Plan:

## 2012-03-02 NOTE — Assessment & Plan Note (Signed)
Refill Ambien, reviewed sleep hygeine.

## 2012-03-02 NOTE — Assessment & Plan Note (Signed)
Congratulated her on her weight loss.  Suggested reading about low carb diets, and trying to decrease carb intake, especially in am, and increase lean protein intake.  Encouraged her to continue exercise, and increase frequency/intensity if possible. Offered nutrition counseling if she is interested.

## 2012-03-02 NOTE — Patient Instructions (Addendum)
It was good to see you.  Congratulations on your weight loss.  I suggest decreasing some of the carbohydrates in your diet and increasing veggies and lean sources of protein. Also, please continue to exercise at least 5 days a week.   I will send you a letter with your lab results.  When you are feeling a little better, you should get a flu shot.   If the lymph node is not getting smaller in one month, please call the office and make an appointment to see me again.

## 2012-03-03 LAB — HIV ANTIBODY (ROUTINE TESTING W REFLEX): HIV: NONREACTIVE

## 2012-03-07 ENCOUNTER — Telehealth: Payer: Self-pay | Admitting: Family Medicine

## 2012-03-07 NOTE — Telephone Encounter (Signed)
Called patient to let her know labs were normal- except CBC shows she is anemic, which she has had before.  Advised if lymph nod is not getting smaller in one months he needs to come in for an office visit.  She voices understanding.

## 2012-03-23 ENCOUNTER — Ambulatory Visit (INDEPENDENT_AMBULATORY_CARE_PROVIDER_SITE_OTHER): Payer: BC Managed Care – PPO | Admitting: Family Medicine

## 2012-03-23 ENCOUNTER — Encounter: Payer: Self-pay | Admitting: Family Medicine

## 2012-03-23 VITALS — BP 159/101 | HR 89 | Temp 98.2°F | Wt 222.0 lb

## 2012-03-23 DIAGNOSIS — R030 Elevated blood-pressure reading, without diagnosis of hypertension: Secondary | ICD-10-CM

## 2012-03-23 DIAGNOSIS — I10 Essential (primary) hypertension: Secondary | ICD-10-CM | POA: Insufficient documentation

## 2012-03-23 DIAGNOSIS — R05 Cough: Secondary | ICD-10-CM | POA: Insufficient documentation

## 2012-03-23 DIAGNOSIS — D649 Anemia, unspecified: Secondary | ICD-10-CM

## 2012-03-23 MED ORDER — BENZONATATE 100 MG PO CAPS: 100.0000 mg | ORAL_CAPSULE | Freq: Three times a day (TID) | ORAL | Status: DC | PRN

## 2012-03-23 MED ORDER — ACETAMINOPHEN-CODEINE #3 300-30 MG PO TABS: 1.0000 | ORAL_TABLET | Freq: Three times a day (TID) | ORAL | Status: DC | PRN

## 2012-03-23 MED ORDER — FERROUS SULFATE 325 (65 FE) MG PO TABS: 325.0000 mg | ORAL_TABLET | Freq: Two times a day (BID) | ORAL | Status: DC

## 2012-03-23 NOTE — Assessment & Plan Note (Signed)
BP elevated today.  No current medications.  Discussed with patient that chronic use of NSAID can elevated BP. - Advised her to avoid NSAID for now - Brief counseling on low sodium diet, weight loss - Recommended follow up with PCP in a few weeks to discuss and recheck BP

## 2012-03-23 NOTE — Assessment & Plan Note (Signed)
Likely 2/2 viral URI.  She is a current smoker. - Encouraged patient to quit smoking  - For cough, sent Tessalon perles PRN during daytime and Tylenol #3 as needed (at bedtime) - Red flags reviewed with patient and per AVS - Follow up as needed

## 2012-03-23 NOTE — Progress Notes (Signed)
  Subjective:     Sherry Jimenez is a 31 y.o. female who presents for evaluation of symptoms of a URI, with post-viral cough. Symptoms include congestion, cough described as productive of yellow sputum and sore throat. Onset of symptoms was 1 week ago, and has been getting worse since that time.  She says cough is worse at night time and wakes her at times.  She denies any fever, chills, nausea/vomiting, or diarrhea. Treatment to date: Mucinex and other OTC remedies.  She currently smokes cigarettes, about 4 cigarettes per day.  BP elevated today as well.  She is not taking any BP medications.  She has been taking Motrin daily and other OTC analgesics for cough which may be causing elevated BP.  Denies any associated HA, blurry vision, numbness/tingling of extremities, or unilateral weakness.  Review of Systems Pertinent items are noted in HPI.   Objective:    BP 159/101  Pulse 89  Temp 98.2 F (36.8 C) (Oral)  Wt 222 lb (100.699 kg) General appearance: alert, cooperative and no distress Throat: lips, mucosa, and tongue normal; teeth and gums normal Lungs: clear to auscultation bilaterally Heart: regular rate and rhythm, S1, S2 normal, no murmur, click, rub or gallop Lymph nodes: tender cervical lymph node on LT; patient says it has been there prior to this illness   Assessment:    viral upper respiratory illness   Plan:    See Problem List

## 2012-03-23 NOTE — Patient Instructions (Addendum)
It was nice to meet you today, Sherry Jimenez. Please pick up 2 cough medications and use as directed. Start taking Iron supplements and increase your fiber intake. If you develop fever, nausea/vomiting, decreased appetite, or worsening cough, please notify MD or return to clinic. Hope you feel better soon!  Upper Respiratory Infection, Adult An upper respiratory infection (URI) is also known as the common cold. It is often caused by a type of germ (virus). Colds are easily spread (contagious). You can pass it to others by kissing, coughing, sneezing, or drinking out of the same glass. Usually, you get better in 1 or 2 weeks.  HOME CARE   Only take medicine as told by your doctor.  Use a warm mist humidifier or breathe in steam from a hot shower.  Drink enough water and fluids to keep your pee (urine) clear or pale yellow.  Get plenty of rest.  Return to work when your temperature is back to normal or as told by your doctor. You may use a face mask and wash your hands to stop your cold from spreading. GET HELP RIGHT AWAY IF:   After the first few days, you feel you are getting worse.  You have questions about your medicine.  You have chills, shortness of breath, or brown or red spit (mucus).  You have yellow or brown snot (nasal discharge) or pain in the face, especially when you bend forward.  You have a fever, puffy (swollen) neck, pain when you swallow, or white spots in the back of your throat.  You have a bad headache, ear pain, sinus pain, or chest pain.  You have a high-pitched whistling sound when you breathe in and out (wheezing).  You have a lasting cough or cough up blood.  You have sore muscles or a stiff neck. MAKE SURE YOU:   Understand these instructions.  Will watch your condition.  Will get help right away if you are not doing well or get worse. Document Released: 10/13/2007 Document Revised: 07/19/2011 Document Reviewed: 08/31/2010 [facility] Patient  Information 2013 [place], [place].

## 2012-07-05 ENCOUNTER — Ambulatory Visit (INDEPENDENT_AMBULATORY_CARE_PROVIDER_SITE_OTHER): Payer: BC Managed Care – PPO | Admitting: Family Medicine

## 2012-07-05 ENCOUNTER — Encounter: Payer: Self-pay | Admitting: Family Medicine

## 2012-07-05 VITALS — BP 174/112 | HR 79 | Temp 98.3°F | Ht 62.0 in | Wt 226.5 lb

## 2012-07-05 DIAGNOSIS — R030 Elevated blood-pressure reading, without diagnosis of hypertension: Secondary | ICD-10-CM

## 2012-07-05 DIAGNOSIS — G4700 Insomnia, unspecified: Secondary | ICD-10-CM

## 2012-07-05 DIAGNOSIS — M545 Low back pain: Secondary | ICD-10-CM

## 2012-07-05 DIAGNOSIS — L708 Other acne: Secondary | ICD-10-CM

## 2012-07-05 DIAGNOSIS — L700 Acne vulgaris: Secondary | ICD-10-CM | POA: Insufficient documentation

## 2012-07-05 MED ORDER — ZOLPIDEM TARTRATE 10 MG PO TABS: 10.0000 mg | ORAL_TABLET | Freq: Every evening | ORAL | Status: DC | PRN

## 2012-07-05 MED ORDER — TRAMADOL HCL 50 MG PO TABS: 50.0000 mg | ORAL_TABLET | Freq: Three times a day (TID) | ORAL | Status: DC | PRN

## 2012-07-05 MED ORDER — CLINDAMYCIN PHOSPHATE 1 % EX GEL: Freq: Two times a day (BID) | CUTANEOUS | Status: DC

## 2012-07-05 MED ORDER — PAROXETINE HCL 20 MG PO TABS: 20.0000 mg | ORAL_TABLET | Freq: Every day | ORAL | Status: DC

## 2012-07-05 MED ORDER — LISINOPRIL 20 MG PO TABS: 20.0000 mg | ORAL_TABLET | Freq: Every day | ORAL | Status: DC

## 2012-07-05 NOTE — Assessment & Plan Note (Signed)
I am concerned that patient may have OCD that is causing her insomnia.  I will start her on Paxil (did not tolerate zoloft in past) to see if this helps with constant "checking" that keeps her up at night.  Also refill on Ambien.

## 2012-07-05 NOTE — Assessment & Plan Note (Signed)
Rx for clindamycin gel. Discussed stopping if she has skin irritation.

## 2012-07-05 NOTE — Progress Notes (Signed)
  Subjective:    Patient ID: Sherry Jimenez, female    DOB: 1981/05/09, 32 y.o.   MRN: [id number]  HPI:  Sherry Jimenez comes in with multiple complaints.    Insomnia: has run out of ambien and cannot sleep. She says she lays down and her mind won't turn off.  She worries about everything- and checks the doors, the windows, the children.  The ambien helped her sleep.  She says no one has told her she had OCD before, but someone did try her on Zoloft before, and it made her feel worse.   Back Pain: She slipped at work and fell backwards about 5 days ago.  She has pain in shoulder blades, low back.  She has not had leg weakness, numbness, or incontinence.  She has been taking ibuprofen which only helps a little.   Acne: very stressed lately and having acne - has tried OTC salicylic acid which and benzoyl peroxide which caused her face to turn red.   BP: has had several elevated blood pressures, her mom has HTN.  She denies any chest pain, dyspnea, lower extremity edema.    Past Medical History  Diagnosis Date  . Anemia   . Gestational diabetes   . UTI (lower urinary tract infection)   . BV (bacterial vaginosis)   . Cholelithiasis   . Vaginal yeast infection     History  Substance Use Topics  . Smoking status: Current Some Day Smoker -- 0.25 packs/day for 12 years  . Smokeless tobacco: Not on file     Comment: electronic cigarette  . Alcohol Use: Yes     Comment: social    Family History  Problem Relation Age of Onset  . Diabetes Father   . Alcohol abuse Father      ROS Pertinent items in HPI    Objective:  Physical Exam:  BP 174/112  Pulse 79  Temp(Src) 98.3 F (36.8 C)  Ht 5\' 2"  (1.575 m)  Wt 226 lb 8 oz (102.74 kg)  BMI 41.42 kg/m2  LMP 06/11/2012 General appearance: alert, cooperative and no distress Head: Normocephalic, without obvious abnormality, atraumatic Lungs: clear to auscultation bilaterally Heart: regular rate and rhythm, S1, S2 normal, no murmur,  click, rub or gallop Pulses: 2+ and symmetric  Back: Normal Curvature, no deformities or CVA tenderness Paraspinal Tenderness: lumbar muscles bilaterally LE Strength 5/5 LE Sensation: in tact LE Reflexes 2+ and symmetric         Assessment & Plan:

## 2012-07-05 NOTE — Assessment & Plan Note (Signed)
Discussed natural course of back pain- gave hand out with home exercise program, advised to be active, heat therapy, Rx for tramadol for pain.

## 2012-07-05 NOTE — Assessment & Plan Note (Signed)
Will start lisinopril for BP, f/u in two weeks.

## 2012-07-05 NOTE — Patient Instructions (Addendum)
It was good to see you.  Try the back exercises and Tramadol for your back pain.   For your insomina- try the paxil to help with OCD "checking" symptoms.  I have refilled your Remus Loffler too.   For your blood pressure- start taking Lisinopril once daily.

## 2012-07-19 ENCOUNTER — Ambulatory Visit (INDEPENDENT_AMBULATORY_CARE_PROVIDER_SITE_OTHER): Payer: BC Managed Care – PPO | Admitting: Family Medicine

## 2012-07-19 VITALS — BP 144/102 | HR 82 | Ht 62.0 in | Wt 224.0 lb

## 2012-07-19 DIAGNOSIS — L700 Acne vulgaris: Secondary | ICD-10-CM

## 2012-07-19 DIAGNOSIS — I10 Essential (primary) hypertension: Secondary | ICD-10-CM

## 2012-07-19 DIAGNOSIS — R5383 Other fatigue: Secondary | ICD-10-CM

## 2012-07-19 DIAGNOSIS — D649 Anemia, unspecified: Secondary | ICD-10-CM

## 2012-07-19 DIAGNOSIS — L708 Other acne: Secondary | ICD-10-CM

## 2012-07-19 DIAGNOSIS — G4700 Insomnia, unspecified: Secondary | ICD-10-CM

## 2012-07-19 LAB — CBC
HCT: 35 % — ABNORMAL LOW (ref 36.0–46.0)
Hemoglobin: 11.3 g/dL — ABNORMAL LOW (ref 12.0–15.0)
MCH: 24.4 pg — ABNORMAL LOW (ref 26.0–34.0)
MCHC: 32.3 g/dL (ref 30.0–36.0)

## 2012-07-19 LAB — BASIC METABOLIC PANEL
CO2: 27 mEq/L (ref 19–32)
Glucose, Bld: 86 mg/dL (ref 70–99)
Potassium: 4 mEq/L (ref 3.5–5.3)
Sodium: 140 mEq/L (ref 135–145)

## 2012-07-19 MED ORDER — LISINOPRIL 40 MG PO TABS: 40.0000 mg | ORAL_TABLET | Freq: Every day | ORAL | Status: DC

## 2012-07-19 NOTE — Assessment & Plan Note (Signed)
Considering fatigue, non-compliance with iron, will re-check cbc.

## 2012-07-19 NOTE — Assessment & Plan Note (Signed)
Not at goal- increase Lisinopril to 40, check BMET.

## 2012-07-19 NOTE — Assessment & Plan Note (Signed)
Check CBC and TSH, Vit D.

## 2012-07-19 NOTE — Patient Instructions (Signed)
It was good to see you.  Please increase your Lisinopril to 40 mg once daily for your blood pressure.   I recommend starting the Paxil when you have a few days off.  Take it at bedtime.   I will send you a letter with your lab results, or call you if anything is abnormal.    Please come back and see me in one month.

## 2012-07-19 NOTE — Progress Notes (Signed)
  Subjective:    Patient ID: Sherry Jimenez, female    DOB: [birth year], 32 y.o.   MRN: [id number]  HPI:  Sherry Jimenez comes in for follow up:   Insomnia/OCD: Patient says she took paxil for two days but she felt very drowsy, dizzy, nauseated from the medication so she stopped it.  She is taking Ambient some nights which helps.  She reports still getting up and "checking" on everything at leas 2 times per night, does not perceive this as abnormal. 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  Says she is close to quitting her job because of how stressful it is. She says that although she is not sleeping she feels tired all the time which makes work worse.  HTN: taking Lisinopril 20 mg PO daily, still having some headaches.  She denies feeling dizzy except days she took the paxil.    Acne: using Clinda gel, face much improved, no over drying or irritation.   Past Medical History  Diagnosis Date  . Anemia   . Gestational diabetes   . UTI (lower urinary tract infection)   . BV (bacterial vaginosis)   . Cholelithiasis   . Vaginal yeast infection     History  Substance Use Topics  . Smoking status: Current Some Day Smoker -- 0.25 packs/day for 12 years  . Smokeless tobacco: Not on file     Comment: electronic cigarette  . Alcohol Use: Yes     Comment: social    Family History  Problem Relation Age of Onset  . Diabetes Father   . Alcohol abuse Father      ROS Pertinent items in HPI    Objective:  Physical Exam:  BP 144/102  Pulse 82  Ht 5\' 2"  (1.575 m)  Wt 224 lb (101.606 kg)  BMI 40.96 kg/m2  LMP 06/11/2012 General appearance: alert, cooperative and no distress Head: Normocephalic, without obvious abnormality, atraumatic Skin: Healing/fading acne comedones  Lungs: clear to auscultation bilaterally Heart: regular rate and rhythm, S1, S2 normal, no murmur, click, rub or gallop Pulses: 2+ and symmetric       Assessment & Plan:

## 2012-07-19 NOTE — Assessment & Plan Note (Addendum)
Discussed how strongly I feel that SSRI would be helpful (I am concerned about OCD). Discussed that side effects would improve after taking medication for about a week, recommended taking at bed time. Patient says she cannot take that medication right now but maybe when she has some time off work.  For now, will continue Ambien and reviewed sleep hygiene

## 2012-07-19 NOTE — Assessment & Plan Note (Signed)
Improving with Clindagel- will continue current treatment.

## 2012-07-21 ENCOUNTER — Telehealth: Payer: Self-pay | Admitting: Family Medicine

## 2012-07-21 ENCOUNTER — Encounter: Payer: Self-pay | Admitting: Family Medicine

## 2012-07-21 MED ORDER — VITAMIN D (ERGOCALCIFEROL) 1.25 MG (50000 UNIT) PO CAPS: 50000.0000 [IU] | ORAL_CAPSULE | ORAL | Status: DC

## 2012-07-21 NOTE — Telephone Encounter (Signed)
Called, left voicemail- pt's Vitamin D is low.  This can contribute to fatigue and poor sleep cycle- I recommend replacement dose and I let her know I was sending it to the pharmacy - it is one pill once weekly for 2-3 months, then usually people need to take an OTC vitamin D supplement.    Let her know the rest of her labs all looked OK, and to call with questions.   CHAMBERLAIN,RACHEL 07/21/2012 9:26 AM

## 2012-07-24 ENCOUNTER — Telehealth: Payer: Self-pay | Admitting: *Deleted

## 2012-07-24 NOTE — Telephone Encounter (Signed)
Call made to f/u with pt after visit last week. Encouraged pt to make appt with [facility]. Pt in a hurry to pick up children due to weather, will call [facility] to schedule.

## 2012-08-15 ENCOUNTER — Telehealth: Payer: Self-pay | Admitting: Family Medicine

## 2012-08-15 NOTE — Telephone Encounter (Signed)
Patient is calling because she has a cold and she would like to know what she can to otc for it.

## 2012-08-15 NOTE — Telephone Encounter (Signed)
Patient has bought some OTC cold & cough med.  Will try this first and call back if not better.  Gaylene Brooks, RN

## 2012-09-13 ENCOUNTER — Other Ambulatory Visit: Payer: Self-pay | Admitting: Family Medicine

## 2012-09-13 NOTE — Telephone Encounter (Signed)
Medication called into pharmacy and left message for pt.

## 2012-09-13 NOTE — Telephone Encounter (Signed)
Please call in Ambein 10 mg PO QHS PRN sleep #30, no refills.  Please let the pharmacy know she should be seen before any more refills.

## 2012-09-20 ENCOUNTER — Ambulatory Visit (INDEPENDENT_AMBULATORY_CARE_PROVIDER_SITE_OTHER): Payer: BC Managed Care – PPO | Admitting: Family Medicine

## 2012-09-20 VITALS — BP 139/90 | HR 93 | Temp 98.3°F | Ht 62.0 in | Wt 225.1 lb

## 2012-09-20 DIAGNOSIS — N938 Other specified abnormal uterine and vaginal bleeding: Secondary | ICD-10-CM

## 2012-09-20 DIAGNOSIS — M545 Low back pain, unspecified: Secondary | ICD-10-CM

## 2012-09-20 DIAGNOSIS — N949 Unspecified condition associated with female genital organs and menstrual cycle: Secondary | ICD-10-CM

## 2012-09-20 DIAGNOSIS — I10 Essential (primary) hypertension: Secondary | ICD-10-CM

## 2012-09-20 MED ORDER — CLINDAMYCIN PHOSPHATE 1 % EX GEL: Freq: Two times a day (BID) | CUTANEOUS | Status: DC

## 2012-09-20 NOTE — Assessment & Plan Note (Signed)
Borderline control on single agent.  Discussed diet and exercise, which she is motivated to do, so will not make medication changes today.

## 2012-09-20 NOTE — Progress Notes (Signed)
  Subjective:    Patient ID: Sherry Jimenez, female    DOB: 02-16-1981, 32 y.o.   MRN: [id number]  HPI:  Sherry Jimenez comes in for follow up:   HTN: taking lisinopril, says she eats a fairly low salt diet.  Denies LE edema, palpitations, chest pain, dyspnea.   Dysfunctional Uterine Bleeding: Says periods have been extremely heavy last 3 months and she has passed clots.  Has taken motrin.  Has had significantly worse pelvic cramping.   Hip/back pain: has pain in left low back, sacral area.  Hurts after a long day at work.  Does do some stretching at the beginning of the day.  Takes some tylenol and motrin for the pain.    Past Medical History  Diagnosis Date  . Anemia   . Gestational diabetes   . UTI (lower urinary tract infection)   . BV (bacterial vaginosis)   . Cholelithiasis   . Vaginal yeast infection     History  Substance Use Topics  . Smoking status: Current Some Day Smoker -- 0.25 packs/day for 12 years  . Smokeless tobacco: Not on file     Comment: electronic cigarette  . Alcohol Use: Yes     Comment: social    Family History  Problem Relation Age of Onset  . Diabetes Father   . Alcohol abuse Father      ROS Pertinent items in HPI    Objective:  Physical Exam:  BP 139/90  Pulse 93  Temp(Src) 98.3 F (36.8 C) (Oral)  Ht 5\' 2"  (1.575 m)  Wt 225 lb 1 oz (102.088 kg)  BMI 41.15 kg/m2  LMP 09/12/2012 General appearance: alert, cooperative and no distress Head: Normocephalic, without obvious abnormality, atraumatic Lungs: clear to auscultation bilaterally Heart: regular rate and rhythm, S1, S2 normal, no murmur, click, rub or gallop Abd: +BS, soft, non-tender no organomegaly Pulses: 2+ and symmetric  Back: Normal Curvature, no deformities or CVA tenderness ROM normal.  Paraspinal Tenderness: sacral area.  LE Strength 5/5 LE Sensation: in tact LE Reflexes 2+ and symmetric        Assessment & Plan:

## 2012-09-20 NOTE — Assessment & Plan Note (Signed)
Suspect SI joint as etiology of pain,  Gave hand out from sports medicine patient advisor with rehab exercises.

## 2012-09-20 NOTE — Patient Instructions (Addendum)
It was good to see you.  Your blood pressure today was BP: 139/90 mmHg.  Remember your goal blood pressure is less than 140/90.  Please be sure to take your medication every day.    For your hip pain, please see the attached hand out with stretches and exercises.  Also, you can try ice therapy in addition to heat, and take tylenol.  You can take ibuprofen as needed for severe pain.   I will order a pelvic ultrasound, I will call you when I see the results.

## 2012-09-20 NOTE — Assessment & Plan Note (Signed)
Will obtain pelvic US to eval for fibroids or other causes of heavy menses.  Discussed different options for bleeding/pain, patient would like to consider GYN referral for discussion of ablation vs hysterectomy pending [place] results.

## 2012-09-25 ENCOUNTER — Ambulatory Visit (HOSPITAL_COMMUNITY)
Admission: RE | Admit: 2012-09-25 | Discharge: 2012-09-25 | Disposition: A | Discharge status: Home / Self Care | Payer: BC Managed Care – PPO | Source: Ambulatory Visit | Attending: Family Medicine | Admitting: Family Medicine

## 2012-09-25 DIAGNOSIS — N938 Other specified abnormal uterine and vaginal bleeding: Secondary | ICD-10-CM | POA: Insufficient documentation

## 2012-09-25 DIAGNOSIS — R1031 Right lower quadrant pain: Secondary | ICD-10-CM | POA: Insufficient documentation

## 2012-09-25 DIAGNOSIS — IMO0002 Reserved for concepts with insufficient information to code with codable children: Secondary | ICD-10-CM | POA: Insufficient documentation

## 2012-09-25 DIAGNOSIS — N949 Unspecified condition associated with female genital organs and menstrual cycle: Secondary | ICD-10-CM | POA: Insufficient documentation

## 2012-09-25 DIAGNOSIS — N946 Dysmenorrhea, unspecified: Secondary | ICD-10-CM | POA: Insufficient documentation

## 2012-09-28 ENCOUNTER — Telehealth: Payer: Self-pay | Admitting: Family Medicine

## 2012-09-28 NOTE — Telephone Encounter (Signed)
Called, left voice mail.  Pelvic US normal- no fibroid or cause of bleeding seen.  Let her know she can consider seeing OBGYN again if she is considering surgery for the bleeding and pain, or can call the office to discuss it with me.

## 2012-10-17 ENCOUNTER — Encounter: Payer: Self-pay | Admitting: Family Medicine

## 2012-10-17 ENCOUNTER — Telehealth: Payer: Self-pay | Admitting: *Deleted

## 2012-10-17 ENCOUNTER — Ambulatory Visit (INDEPENDENT_AMBULATORY_CARE_PROVIDER_SITE_OTHER): Payer: BC Managed Care – PPO | Admitting: Family Medicine

## 2012-10-17 VITALS — BP 142/103 | HR 77 | Temp 98.3°F | Ht 62.0 in | Wt 224.0 lb

## 2012-10-17 DIAGNOSIS — H1010 Acute atopic conjunctivitis, unspecified eye: Secondary | ICD-10-CM | POA: Insufficient documentation

## 2012-10-17 DIAGNOSIS — R05 Cough: Secondary | ICD-10-CM

## 2012-10-17 DIAGNOSIS — N949 Unspecified condition associated with female genital organs and menstrual cycle: Secondary | ICD-10-CM

## 2012-10-17 DIAGNOSIS — H1011 Acute atopic conjunctivitis, right eye: Secondary | ICD-10-CM

## 2012-10-17 DIAGNOSIS — N938 Other specified abnormal uterine and vaginal bleeding: Secondary | ICD-10-CM

## 2012-10-17 DIAGNOSIS — E669 Obesity, unspecified: Secondary | ICD-10-CM

## 2012-10-17 DIAGNOSIS — H1045 Other chronic allergic conjunctivitis: Secondary | ICD-10-CM

## 2012-10-17 MED ORDER — FLUTICASONE PROPIONATE 50 MCG/ACT NA SUSP: 2.0000 | Freq: Every day | NASAL | Status: DC

## 2012-10-17 MED ORDER — CETIRIZINE HCL 10 MG PO TABS: 10.0000 mg | ORAL_TABLET | Freq: Every day | ORAL | Status: DC

## 2012-10-17 MED ORDER — OLOPATADINE HCL 0.2 % OP SOLN: 2.0000 [drp] | Freq: Every day | OPHTHALMIC | Status: DC

## 2012-10-17 MED ORDER — ZOLPIDEM TARTRATE 10 MG PO TABS: 10.0000 mg | ORAL_TABLET | Freq: Every evening | ORAL | Status: DC | PRN

## 2012-10-17 MED ORDER — BENZONATATE 100 MG PO CAPS: 100.0000 mg | ORAL_CAPSULE | Freq: Three times a day (TID) | ORAL | Status: DC | PRN

## 2012-10-17 NOTE — Patient Instructions (Signed)
It was good to see you.  I am referring you to Gynecology about your bleeding.   Please try the eye drops, the zyrtec, and the flonase for your allergies and irritated eye.  Also, do cold compresses several times per day.  This looks like an allergic conjunctivitis, not a bacterial conjunctivitis (pink eye).  You can go back to work tomorrow unless this is getting worse.   I dot not think weight loss pills are safe- especially not in someone with high blood pressure.

## 2012-10-17 NOTE — Progress Notes (Signed)
  Subjective:    Patient ID: Sherry Jimenez, female    DOB: 07-27-1980, 32 y.o.   MRN: [id number]  HPI:  Josely comes in for a same day visit for concern for pink eye but also has several other complaints.   Pink eye: started with R eye irritation yesterday.  She woke up with some dry crust on the eye.  It is very itchy and mildly painful.  She denies any vision changes, fever.  No sick contacts, no one else has had this.  She says she has not been taking her allergy medications because she was not sure if it was OK to take with her blood pressure medicaitons.   Cough: concerned lisinopril is causing cough, but has been suffering through allergies off of medications.  Has had nasal congestion all spring long.  Says throat feels irritated and then she coughs a lot.   Dysfunctional Uterine bleeding: had to stay home from work last week due to heavy periods and severe pain.  NSAIDS help some.  Had normal pelvic US one month ago was normal.   Obesity: has been watching her diet, minimizing starches, has not gained any more weight but has not lost any.  Asks about a new weight loss pill she saw a commercial for called Belviq.    Past Medical History  Diagnosis Date  . Anemia   . Gestational diabetes   . UTI (lower urinary tract infection)   . BV (bacterial vaginosis)   . Cholelithiasis   . Vaginal yeast infection     History  Substance Use Topics  . Smoking status: Current Some Day Smoker -- 0.25 packs/day for 12 years  . Smokeless tobacco: Not on file     Comment: electronic cigarette  . Alcohol Use: Yes     Comment: social    Family History  Problem Relation Age of Onset  . Diabetes Father   . Alcohol abuse Father      ROS Pertinent items in HPI    Objective:  Physical Exam:  BP 142/103  Pulse 77  Temp(Src) 98.3 F (36.8 C) (Oral)  Ht 5\' 2"  (1.575 m)  Wt 224 lb (101.606 kg)  BMI 40.96 kg/m2  LMP 09/12/2012 General appearance: alert, cooperative and no  distress Head: Normocephalic, without obvious abnormality, atraumatic Eyes: R eye with moderate pink injection, swollen mucous membranes, but no purulent drainage or stye.  L eye with very mild pink injection.  Fundoscopic exam WNL bilaterally, PERRL.  Lungs: clear to auscultation bilaterally Heart: regular rate and rhythm, S1, S2 normal, no murmur, click, rub or gallop Pulses: 2+ and symmetric       Assessment & Plan:

## 2012-10-17 NOTE — Assessment & Plan Note (Signed)
Patient interested in discussing surgical options, will refer to GYN.

## 2012-10-17 NOTE — Assessment & Plan Note (Signed)
Will treat allergic rhinitis and see if cough improves, if it does not, consider switch to ARB.

## 2012-10-17 NOTE — Assessment & Plan Note (Signed)
Discussed that weight loss pills can have CV side effects, and in someone with HTN I do not feel they are safe.  Discussed increasing exercise and diet modification.

## 2012-10-17 NOTE — Telephone Encounter (Signed)
Pt is requesting something cheaper than Pataday ($100 copay) - please advise. Wyatt Haste, RN-BSN

## 2012-10-17 NOTE — Assessment & Plan Note (Signed)
Rx for pataday drops, re-start flonase and cetirizine.  Advised cold compresses, OK to return to work if not worsening.

## 2012-10-18 NOTE — Telephone Encounter (Signed)
Left message on vm that pt may use otc eye drop such as visine allergy relief - encourage to call if she had any further needs.  Wyatt Haste, RN-BSN

## 2012-10-18 NOTE — Telephone Encounter (Signed)
She can buy an over the counter antihistamine eye drop.  Let her know to ask the pharmacist if she needs help finding one.

## 2012-11-20 ENCOUNTER — Other Ambulatory Visit: Payer: Self-pay | Admitting: Family Medicine

## 2012-11-24 ENCOUNTER — Ambulatory Visit: Payer: BC Managed Care – PPO | Admitting: Family Medicine

## 2012-11-27 ENCOUNTER — Ambulatory Visit (INDEPENDENT_AMBULATORY_CARE_PROVIDER_SITE_OTHER): Payer: BC Managed Care – PPO | Admitting: Family Medicine

## 2012-11-27 ENCOUNTER — Encounter: Payer: Self-pay | Admitting: Family Medicine

## 2012-11-27 VITALS — BP 125/86 | HR 75 | Temp 98.1°F | Ht 62.0 in | Wt 222.0 lb

## 2012-11-27 DIAGNOSIS — N949 Unspecified condition associated with female genital organs and menstrual cycle: Secondary | ICD-10-CM

## 2012-11-27 DIAGNOSIS — I10 Essential (primary) hypertension: Secondary | ICD-10-CM

## 2012-11-27 DIAGNOSIS — R059 Cough, unspecified: Secondary | ICD-10-CM

## 2012-11-27 DIAGNOSIS — R05 Cough: Secondary | ICD-10-CM

## 2012-11-27 DIAGNOSIS — E559 Vitamin D deficiency, unspecified: Secondary | ICD-10-CM

## 2012-11-27 DIAGNOSIS — N938 Other specified abnormal uterine and vaginal bleeding: Secondary | ICD-10-CM

## 2012-11-27 DIAGNOSIS — D649 Anemia, unspecified: Secondary | ICD-10-CM

## 2012-11-27 MED ORDER — VITAMIN D3 1.25 MG (50000 UT) PO CAPS: 50000.0000 [IU] | ORAL_CAPSULE | ORAL | Status: DC

## 2012-11-27 MED ORDER — LOSARTAN POTASSIUM 50 MG PO TABS: 50.0000 mg | ORAL_TABLET | Freq: Every day | ORAL | Status: DC

## 2012-11-27 NOTE — Assessment & Plan Note (Signed)
Not taking Iron pills b/c GI discomfort - Discussed Iron fortified foods

## 2012-11-27 NOTE — Assessment & Plan Note (Signed)
Switched ACE to ARB due to cough - If cough remains = Most likely due to tobacco use

## 2012-11-27 NOTE — Assessment & Plan Note (Signed)
Switched ACE to ARB today to see if relieves cough; However cough is probably due to Cig. use

## 2012-11-27 NOTE — Assessment & Plan Note (Signed)
Pt still complains of heavy bleeding 3 days a month w/ daily cramping pain - Discussed OCPs and IUD; Pt not interested due to previous Perforation - Has apt with Ob/Gyn soon  1) Future Lab for CBC to assess anemia - Not taking Iron supplements due to GI side-effects

## 2012-11-27 NOTE — Patient Instructions (Addendum)
It was great seeing you today. Below is a list of the things we talked about:   1) Take 50,000 units of Vit D once a week for 4 weeks  2) Switched BP medication  3) Return to Clinic in 4 weeks  Please check-out at the front desk before leaving the clinic.  I look forward to talking with you again at our next visit. If you have any questions or concerns before then, please call the clinic at [phone].  See you soon,   Dr Wenda Low

## 2012-11-27 NOTE — Assessment & Plan Note (Signed)
Restarted Vit D3 (50,000 IU) as pt unsure how much she took  1) Future lab for Vit D level in 4wks

## 2012-11-27 NOTE — Progress Notes (Signed)
[facility]  Patient name: Sherry Jimenez MRN [id number]  Date of birth: [birth year]  CC & HPI:  Sherry Jimenez is a 32 y.o. female presenting today for Wellness Check for her health insurance. She does not bring any paperwork to be completed. Otherwise is without complaint.   Discussed previous Hypo Vit D - She reports taking 50,000, but can't recall how much and how long she took it  Discussed Menstrual irregularities  - Has apt with OB/Gyn to discuss - Not interested in OCPs or IUD (previous perforation)  - Not interested in smoking cessation  Discussed Paxil - Stopped taking due to side-effects - York Spaniel was prescribed for OCD, which does not interfere with her fcn - Not interested in medication at this time  CHRONIC HYPERTENSION  Disease Monitoring  Blood pressure range: Well controlled today  Chest pain: no   Dyspnea: no   Claudication: no   Medication compliance: yes  Medication Side Effects  Lightheadedness: no   Urinary frequency: no   Edema: no   Preventitive Healthcare:  Exercise: no     ROS:  Please See HPI above   Pertinent History Reviewed:  Medical & Surgical Hx:  Reviewed: Medications & Allergies: Reviewed & Updated - see associated section  Social History: Reviewed:   reports that she has been smoking.  She does not have any smokeless tobacco history on file.  History  Alcohol Use  . Yes    Comment: social   History  Drug Use No    Objective Findings:  Vitals: BP 125/86  Pulse 75  Temp(Src) 98.1 F (36.7 C) (Oral)  Ht 5\' 2"  (1.575 m)  Wt 222 lb (100.699 kg)  BMI 40.59 kg/m2  LMP 11/01/2012  Gen: NAD Neck: Supple & nontender; Trachea midline; Thyroid normal; No lymph nodes palpated  CV: RRR w/o m/r/g Resp: CTAB w/ normal respiratory effort GI: No skin changes; BS + x 4 quads; No tenderness or masses, No CVA tenderness Back: Normal skin w/o rash, Spine with normal alignment and no deformity. No  tenderness to vertebral process palpation.  Paraspinous muscles are tender and without spasm.   Range of motion is full at neck and lumbar sacral regions Lower Ext: No skin changes; No edema; distal pulses intact; Calves nontender   Assessment & Plan:   Please See Problem Focused Assessment & Plan

## 2012-12-05 ENCOUNTER — Encounter: Payer: Self-pay | Admitting: Family Medicine

## 2012-12-05 ENCOUNTER — Ambulatory Visit (INDEPENDENT_AMBULATORY_CARE_PROVIDER_SITE_OTHER): Payer: BC Managed Care – PPO | Admitting: Family Medicine

## 2012-12-05 VITALS — BP 133/88 | HR 80 | Temp 98.6°F | Ht 62.0 in | Wt 222.0 lb

## 2012-12-05 DIAGNOSIS — R112 Nausea with vomiting, unspecified: Secondary | ICD-10-CM

## 2012-12-05 DIAGNOSIS — L708 Other acne: Secondary | ICD-10-CM

## 2012-12-05 DIAGNOSIS — L700 Acne vulgaris: Secondary | ICD-10-CM

## 2012-12-05 MED ORDER — ONDANSETRON HCL 4 MG PO TABS: 4.0000 mg | ORAL_TABLET | Freq: Three times a day (TID) | ORAL | Status: DC | PRN

## 2012-12-05 NOTE — Patient Instructions (Signed)
Come back in 2-3 days if not resolving. Come back sooner if symptoms worsen or you develop fevers/chills. Get some rest the next few days to see if that helps knock things out.   Thanks, Dr. Nils Pyle will need this: Health Maintenance Due  Topic Date Due  . Tetanus/tdap  06/17/1998

## 2012-12-06 MED ORDER — CLINDAMYCIN PHOSPHATE 1 % EX GEL: Freq: Two times a day (BID) | CUTANEOUS | Status: DC

## 2012-12-06 NOTE — Progress Notes (Signed)
  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 Yesterday 7/28 while working she became very nauseous and threw up on 2 occasions (nonbilious, nonbloody) before 11 am. Micah Flesher to car to lay down but didn't feel better so went home. DId not throw up that evening but continued with slight nausea. Next day, similar occurrence at work with vomiting x 2. Eating fine at home she says but cannot tolerate food at work. LMP 12/03/12. With vomiting only she feels like a band of pain across her mid to upper abdomen but once again, has not felt this at home. Not particularly associated with certain foods. No diarrhea. Denies fever/chills. Denies recent sick contacts. Is not a smoker.  Patient with last BM on Sunday but not that rare for it to be at least every other day for patient. Nothing other than work sems to make it worse. not associated with cough. No dysuria/polyuria. No vaginal discharge.   ROS--See HPI  Past Medical History-low vitamin D, DUB (states takes tramadol but deferred refill for PCP as NSAIDS seem more appropriate), acne,  (refilled clindamycin), anemia, obesity, hypertension Surgical history-cholecystectomy  Reviewed problem list.  Medications- reviewed and updated Chief complaint-noted  Objective: BP 133/88  Pulse 80  Temp(Src) 98.6 F (37 C) (Oral)  Ht 5\' 2"  (1.575 m)  Wt 222 lb (100.699 kg)  BMI 40.59 kg/m2  LMP 12/03/2012 Gen: NAD, resting comfortably on table HEENT: MMM CV: RRR no murmurs rubs or gallops Lungs: CTAB no crackles, wheeze, rhonchi Abdomen: soft/nontender/nondistended/normal bowel sounds/no rebounding or guarding Skin: warm, dry Neuro: grossly normal, moves all extremities  Results for orders placed in visit on 12/05/12 (from the past 48 hour(s))  POCT URINE PREGNANCY     Status: None   Collection Time    12/05/12  4:17 PM      Result Value  Range   Preg Test, Ur Negative    Called patient with result  Assessment/Plan:  # Vomiting Unclear etiology. Hx cholecystectomy which also makes pancreatitis less likely and almost no abdominal pain. Unlikely ectopic as period is typical for her and upreg negative. Early pregnancy also unlikely as on period.  Discussed labwork vs treating symptomatically and following up if not improved and patient opts for zofran and close follow up. Would consider CBC with diff, lipase, cmet if patient returns. Doubt RUQ u/s would be beneficial. Most likely, this is a viral gastroenteritis that is early and is exacerbated by not resting at work. Have asked patient to take it easy for next few days (work note given).

## 2012-12-12 ENCOUNTER — Other Ambulatory Visit: Payer: Self-pay | Admitting: Family Medicine

## 2013-01-12 ENCOUNTER — Ambulatory Visit: Payer: BC Managed Care – PPO | Admitting: Family Medicine

## 2013-04-16 ENCOUNTER — Other Ambulatory Visit: Payer: Self-pay | Admitting: Family Medicine

## 2013-06-27 ENCOUNTER — Other Ambulatory Visit: Payer: Self-pay | Admitting: Family Medicine

## 2013-07-28 ENCOUNTER — Other Ambulatory Visit: Payer: Self-pay | Admitting: Family Medicine

## 2013-07-30 ENCOUNTER — Encounter (HOSPITAL_COMMUNITY): Payer: Self-pay | Admitting: Emergency Medicine

## 2013-07-30 ENCOUNTER — Other Ambulatory Visit: Payer: Self-pay | Admitting: Family Medicine

## 2013-07-30 ENCOUNTER — Emergency Department (INDEPENDENT_AMBULATORY_CARE_PROVIDER_SITE_OTHER)
Admission: EM | Admit: 2013-07-30 | Discharge: 2013-07-30 | Disposition: A | Discharge status: Home / Self Care | Payer: Self-pay | Source: Home / Self Care | Attending: Family Medicine | Admitting: Family Medicine

## 2013-07-30 ENCOUNTER — Ambulatory Visit: Payer: Self-pay | Admitting: Sports Medicine

## 2013-07-30 ENCOUNTER — Emergency Department (HOSPITAL_COMMUNITY)
Admission: EM | Admit: 2013-07-30 | Discharge: 2013-07-30 | Disposition: A | Discharge status: Home / Self Care | Payer: Self-pay | Attending: Emergency Medicine | Admitting: Emergency Medicine

## 2013-07-30 DIAGNOSIS — Z8632 Personal history of gestational diabetes: Secondary | ICD-10-CM | POA: Insufficient documentation

## 2013-07-30 DIAGNOSIS — Z79899 Other long term (current) drug therapy: Secondary | ICD-10-CM | POA: Insufficient documentation

## 2013-07-30 DIAGNOSIS — F17200 Nicotine dependence, unspecified, uncomplicated: Secondary | ICD-10-CM | POA: Insufficient documentation

## 2013-07-30 DIAGNOSIS — G609 Hereditary and idiopathic neuropathy, unspecified: Secondary | ICD-10-CM | POA: Insufficient documentation

## 2013-07-30 DIAGNOSIS — R6889 Other general symptoms and signs: Secondary | ICD-10-CM

## 2013-07-30 DIAGNOSIS — R3589 Other polyuria: Secondary | ICD-10-CM | POA: Insufficient documentation

## 2013-07-30 DIAGNOSIS — G629 Polyneuropathy, unspecified: Secondary | ICD-10-CM

## 2013-07-30 DIAGNOSIS — M2550 Pain in unspecified joint: Secondary | ICD-10-CM | POA: Insufficient documentation

## 2013-07-30 DIAGNOSIS — Z8619 Personal history of other infectious and parasitic diseases: Secondary | ICD-10-CM | POA: Insufficient documentation

## 2013-07-30 DIAGNOSIS — I7300 Raynaud's syndrome without gangrene: Secondary | ICD-10-CM

## 2013-07-30 DIAGNOSIS — Z87440 Personal history of urinary (tract) infections: Secondary | ICD-10-CM | POA: Insufficient documentation

## 2013-07-30 DIAGNOSIS — Z8719 Personal history of other diseases of the digestive system: Secondary | ICD-10-CM | POA: Insufficient documentation

## 2013-07-30 DIAGNOSIS — D649 Anemia, unspecified: Secondary | ICD-10-CM | POA: Insufficient documentation

## 2013-07-30 DIAGNOSIS — R358 Other polyuria: Secondary | ICD-10-CM | POA: Insufficient documentation

## 2013-07-30 LAB — GLUCOSE, CAPILLARY: Glucose-Capillary: 76 mg/dL (ref 70–99)

## 2013-07-30 MED ORDER — LISINOPRIL 40 MG PO TABS: 40.0000 mg | ORAL_TABLET | Freq: Every day | ORAL | Status: DC

## 2013-07-30 MED ORDER — GABAPENTIN 100 MG PO CAPS: 100.0000 mg | ORAL_CAPSULE | Freq: Three times a day (TID) | ORAL | Status: DC

## 2013-07-30 NOTE — Discharge Instructions (Signed)
Please contact your primary care physician for further evaluation.

## 2013-07-30 NOTE — ED Notes (Signed)
Pt  Reports  Symptoms  Of     Hands  And  Feet  Cool  Tingling           Sensation           X 5  Months  Recently   Became            painfull  To  The  Touch        Pt is  A  Smoker   Pt  Reports  Symptoms  releived  By  Juleen StarrWarmth

## 2013-07-30 NOTE — ED Provider Notes (Signed)
CSN: [id number]     Arrival date & time 07/30/13  1478 History   First MD Initiated Contact with Patient 07/30/13 1039     Chief Complaint  Patient presents with  . Cold Extremity   (Consider location/radiation/quality/duration/timing/severity/associated sxs/prior Treatment) HPI Comments: Patient reports a 5-6 month history for cold intolerance with associated intermittent numbness and tingling of hands and feet. States she feels cold all the time. Reports she has been evaluated by her PCP about one year ago for same and had normal thyroid studies and was informed she had mild anemia and has been taking OTC iron supplements. Old labs reviewed and consistent with above. vit D noted to be low and Vit B12 level ordered but it does not appear that this testing was ever completed.  Patient is a smoker and is obese No unexplained weight loss or weight gain. Works at [employer]  The history is provided by the patient.    Past Medical History  Diagnosis Date  . Anemia   . Gestational diabetes   . UTI (lower urinary tract infection)   . BV (bacterial vaginosis)   . Cholelithiasis   . Vaginal yeast infection    Past Surgical History  Procedure Laterality Date  . Cholecystectomy    . Tonsilectomy, adenoidectomy, bilateral myringotomy and tubes     Family History  Problem Relation Age of Onset  . Diabetes Father   . Alcohol abuse Father    History  Substance Use Topics  . Smoking status: Current Some Day Smoker -- 0.25 packs/day for 12 years  . Smokeless tobacco: Not on file     Comment: electronic cigarette  . Alcohol Use: Yes     Comment: social   OB History   Grav Para Term Preterm Abortions TAB SAB Ect Mult Living                 Review of Systems  Constitutional: Positive for fatigue.  HENT: Negative.   Eyes: Negative.   Respiratory: Negative.   Cardiovascular: Negative.   Gastrointestinal: Negative.   Endocrine: Positive for cold intolerance. Negative for heat  intolerance, polydipsia and polyuria.  Genitourinary: Negative.   Musculoskeletal: Positive for myalgias.  Skin: Negative.   Allergic/Immunologic: Negative.   Neurological: Positive for numbness.  Hematological: Negative.   Psychiatric/Behavioral: Negative.     Allergies  Review of patient's allergies indicates no known allergies.  Home Medications   Current Outpatient Rx  Name  Route  Sig  Dispense  Refill  . cetirizine (ZYRTEC) 10 MG tablet   Oral   Take 1 tablet (10 mg total) by mouth at bedtime.   30 tablet   11   . Cholecalciferol (VITAMIN D3) 50000 UNITS CAPS   Oral   Take 50,000 Int'l Units by mouth once a week.   8 capsule   0   . clindamycin (CLINDAGEL) 1 % gel   Topical   Apply topically 2 (two) times daily.   30 g   3   . EXPIRED: ferrous sulfate 325 (65 FE) MG tablet   Oral   Take 1 tablet (325 mg total) by mouth 2 (two) times daily before a meal.   30 tablet   11   . fluticasone (FLONASE) 50 MCG/ACT nasal spray   Nasal   Place 2 sprays into the nose daily.   16 g   6   . lisinopril (PRINIVIL,ZESTRIL) 40 MG tablet      TAKE 1 TABLET BY MOUTH EVERY DAY  30 tablet   3   . losartan (COZAAR) 50 MG tablet   Oral   Take 1 tablet (50 mg total) by mouth daily.   90 tablet   3   . Olopatadine HCl 0.2 % SOLN   Ophthalmic   Apply 2 drops to eye daily.   2.5 mL   0   . ondansetron (ZOFRAN) 4 MG tablet   Oral   Take 1 tablet (4 mg total) by mouth every 8 (eight) hours as needed for nausea.   20 tablet   0   . traMADol (ULTRAM) 50 MG tablet   Oral   Take 1 tablet (50 mg total) by mouth every 8 (eight) hours as needed for pain.   40 tablet   1   . zolpidem (AMBIEN) 10 MG tablet   Oral   Take 1 tablet (10 mg total) by mouth at bedtime as needed for sleep.   30 tablet   3    BP 120/79  Pulse 67  Temp(Src) 98.5 F (36.9 C) (Oral)  SpO2 100% Physical Exam  Nursing note and vitals reviewed. Constitutional: She is oriented to person,  place, and time. She appears well-developed and well-nourished. No distress.  +obese  HENT:  Head: Normocephalic and atraumatic.  Eyes: Conjunctivae and EOM are normal. Pupils are equal, round, and reactive to light. No scleral icterus.  Neck: Normal range of motion. Neck supple. No thyromegaly present.  Cardiovascular: Normal rate, regular rhythm and normal heart sounds.   Pulmonary/Chest: Effort normal and breath sounds normal.  Abdominal: Soft. Bowel sounds are normal. She exhibits no distension. There is no tenderness.  Musculoskeletal: Normal range of motion. She exhibits no edema and no tenderness.       Right hand: Normal.       Left hand: Normal.       Right foot: Normal.       Left foot: Normal.  Lymphadenopathy:    She has no cervical adenopathy.  Neurological: She is alert and oriented to person, place, and time. She has normal strength. No cranial nerve deficit or sensory deficit. She displays a negative Romberg sign. Coordination and gait normal. GCS eye subscore is 4. GCS verbal subscore is 5. GCS motor subscore is 6.  Skin: Skin is warm and dry. No rash noted. No erythema.  Psychiatric: She has a normal mood and affect. Her behavior is normal.    ED Course  Procedures (including critical care time) Labs Review Labs Reviewed - No data to display Imaging Review No results found.   MDM   1. Cold intolerance    Advised patient to continue her evaluation at her PCP office.     Jess BartersJennifer Lee West ParkPresson, [place] 07/30/13 1121

## 2013-07-30 NOTE — ED Notes (Signed)
States for the past few months  4-5 she has been getting numbness and tingling in her haND AND FEET comes and goes [facility] tests pt states  She has been driving and her feet have gone to sleep and it waas scary

## 2013-07-30 NOTE — Discharge Instructions (Signed)
Raynaud's Syndrome Raynaud's Syndrome is a disorder of the blood vessels in your hands and feet. It occurs when small arteries of the arms/hands or legs/feet become sensitive to cold or emotional upset. This causes the arteries to constrict, or narrow, and reduces blood flow to the area. The color in the fingers or toes changes from white to bluish to red and this is not usually painful. There may be numbness and tingling. Sores on the skin (ulcers) can form. Symptoms are usually relieved by warming. HOME CARE INSTRUCTIONS   Avoid exposure to cold. Keep your whole body warm and dry. Dress in layers. Wear mittens or gloves when handling ice or frozen food and when outdoors. Use holders for glasses or cans containing cold drinks. If possible, stay indoors during cold weather.  Limit your use of caffeine. Switch to decaffeinated coffee, tea, and soda pop. Avoid chocolate.  Avoid smoking or being around cigarette smoke. Smoke will make symptoms worse.  Wear loose fitting socks and comfortable, roomy shoes.  Avoid vibrating tools and machinery.  If possible, avoid stressful and emotional situations. Exercise, meditation and yoga may help you cope with stress. Biofeedback may be useful.  Ask your caregiver about medicine (calcium channel blockers) that may control Raynaud's phenomena. SEEK MEDICAL CARE IF:   Your discomfort becomes worse, despite conservative treatment.  You develop sores on your fingers and toes that do not heal. Document Released: 04/23/2000 Document Revised: 07/19/2011 Document Reviewed: 04/30/2008 Exit[employer] Patient Information 2014 [employer].  

## 2013-07-30 NOTE — ED Notes (Signed)
Pt  On  Discharge    Became  Anxious  About  Her  Symptoms  And      The  PA   Spoke        Again  With  Her  And  Discussed  Her  Plan of  Care

## 2013-07-30 NOTE — ED Provider Notes (Addendum)
CSN: [id number]     Arrival date & time 07/30/13  1218 History   First MD Initiated Contact with Patient 07/30/13 1422     Chief Complaint  Patient presents with  . Numbness     (Consider location/radiation/quality/duration/timing/severity/associated sxs/prior Treatment) HPI Comments: Pt with 5 months of intermittent tingling and numbness to both hands and feet, has been getting worse reently, reports has worse pain to the ponit difficult to drive and use foot pedals, to stand for periods of time.  Symptom will always resolve, but continues to get more frequent.  Pt reports has had fingers turn white in the past and feet and hands feels cold all of the time.  No h/o DM.  Pt apparently seen at urgent care and referred back to PCP, but pt chose to come to the ED.  No weakness, no CP, SOB.  No fevers, chills, rash.  Pt smokes.  Pt has h/o HTN.  Pt used to have insurance, but no longer and thus has not seen her usual PCP Dr. Gayla Doss in some time.    The history is provided by the patient and medical records.    Past Medical History  Diagnosis Date  . Anemia   . Gestational diabetes   . UTI (lower urinary tract infection)   . BV (bacterial vaginosis)   . Cholelithiasis   . Vaginal yeast infection    Past Surgical History  Procedure Laterality Date  . Cholecystectomy    . Tonsilectomy, adenoidectomy, bilateral myringotomy and tubes     Family History  Problem Relation Age of Onset  . Diabetes Father   . Alcohol abuse Father    History  Substance Use Topics  . Smoking status: Current Some Day Smoker -- 0.25 packs/day for 12 years  . Smokeless tobacco: Not on file     Comment: electronic cigarette  . Alcohol Use: Yes     Comment: social   OB History   Grav Para Term Preterm Abortions TAB SAB Ect Mult Living                 Review of Systems  Constitutional: Negative for fever.  Respiratory: Negative for chest tightness and shortness of breath.   Cardiovascular: Negative for  chest pain.  Endocrine: Positive for polyuria.  Musculoskeletal: Positive for arthralgias. Negative for joint swelling.  Skin: Positive for color change. Negative for rash and wound.  Neurological: Positive for numbness.  All other systems reviewed and are negative.      Allergies  Review of patient's allergies indicates no known allergies.  Home Medications   Current Outpatient Rx  Name  Route  Sig  Dispense  Refill  . cetirizine (ZYRTEC) 10 MG tablet   Oral   Take 1 tablet (10 mg total) by mouth at bedtime.   30 tablet   11   . EXPIRED: ferrous sulfate 325 (65 FE) MG tablet   Oral   Take 1 tablet (325 mg total) by mouth 2 (two) times daily before a meal.   30 tablet   11   . gabapentin (NEURONTIN) 100 MG capsule   Oral   Take 1 capsule (100 mg total) by mouth 3 (three) times daily.   1 capsule   90   . lisinopril (PRINIVIL,ZESTRIL) 40 MG tablet      TAKE 1 TABLET BY MOUTH EVERY DAY   30 tablet   0     She need to make apt with PCP before additional re .Marland KitchenMarland Kitchen  BP 116/70  Pulse 65  Temp(Src) 97.8 F (36.6 C) (Oral)  Resp 18  Wt 207 lb (93.895 kg)  SpO2 98% Physical Exam  Nursing note and vitals reviewed. Constitutional: She is oriented to person, place, and time. She appears well-developed and well-nourished. No distress.  HENT:  Head: Normocephalic and atraumatic.  Eyes: Conjunctivae are normal. No scleral icterus.  Cardiovascular: Normal rate, regular rhythm and intact distal pulses.   Pulses:      Radial pulses are 2+ on the right side, and 2+ on the left side.       Dorsalis pedis pulses are 2+ on the right side, and 2+ on the left side.  Pulmonary/Chest: Effort normal.  Abdominal: Soft.  Neurological: She is alert and oriented to person, place, and time. She exhibits normal muscle tone. Coordination normal.  Skin: Skin is warm and dry. No rash noted. She is not diaphoretic. No pallor.    ED Course  Procedures (including critical care  time) Labs Review Labs Reviewed - No data to display Imaging Review No results found.   EKG Interpretation None      RA sat is 98% and I interpret to be normal   MDM   Final diagnoses:  Peripheral neuropathy  Raynaud's phenomenon (by history or observed)    By description, pt is experiencing Raynaud's phenomenon, likely has syndrome.  Good distal pulses.  Pt may have peripheral vascular disease and pt instructed to stop smoking.  [facility] and given Rx for neurontin.      Gavin PoundMichael Y. Oletta LamasGhim, MD 07/30/13 1527  Gavin PoundMichael Y. Leinani Lisbon, MD 07/30/13 1527

## 2013-08-01 NOTE — ED Provider Notes (Signed)
Medical screening examination/treatment/procedure(s) were performed by a resident physician or non-physician practitioner and as the supervising physician I was immediately available for consultation/collaboration.  Choice Kleinsasser, MD    Mattheo Swindle S Ervin Rothbauer, MD 08/01/13 0749 

## 2013-08-02 ENCOUNTER — Ambulatory Visit: Payer: Self-pay

## 2013-08-03 ENCOUNTER — Ambulatory Visit: Payer: Self-pay | Admitting: Family Medicine

## 2013-08-14 ENCOUNTER — Encounter: Payer: Self-pay | Admitting: Family Medicine

## 2013-08-14 ENCOUNTER — Ambulatory Visit (INDEPENDENT_AMBULATORY_CARE_PROVIDER_SITE_OTHER): Payer: Self-pay | Admitting: Family Medicine

## 2013-08-14 VITALS — BP 121/84 | HR 89 | Temp 99.0°F | Wt 209.0 lb

## 2013-08-14 DIAGNOSIS — M2550 Pain in unspecified joint: Secondary | ICD-10-CM | POA: Insufficient documentation

## 2013-08-14 DIAGNOSIS — E559 Vitamin D deficiency, unspecified: Secondary | ICD-10-CM

## 2013-08-14 DIAGNOSIS — D649 Anemia, unspecified: Secondary | ICD-10-CM

## 2013-08-14 LAB — CBC WITH DIFFERENTIAL/PLATELET
Basophils Absolute: 0 10*3/uL (ref 0.0–0.1)
Basophils Relative: 0 % (ref 0–1)
EOS PCT: 5 % (ref 0–5)
Eosinophils Absolute: 0.4 10*3/uL (ref 0.0–0.7)
HCT: 33.9 % — ABNORMAL LOW (ref 36.0–46.0)
HEMOGLOBIN: 11.4 g/dL — AB (ref 12.0–15.0)
LYMPHS ABS: 2.5 10*3/uL (ref 0.7–4.0)
Lymphocytes Relative: 36 % (ref 12–46)
MCH: 26.1 pg (ref 26.0–34.0)
MCHC: 33.6 g/dL (ref 30.0–36.0)
MCV: 77.6 fL — AB (ref 78.0–100.0)
Monocytes Absolute: 0.4 10*3/uL (ref 0.1–1.0)
Monocytes Relative: 6 % (ref 3–12)
Neutro Abs: 3.7 10*3/uL (ref 1.7–7.7)
Neutrophils Relative %: 53 % (ref 43–77)
Platelets: 265 10*3/uL (ref 150–400)
RBC: 4.37 MIL/uL (ref 3.87–5.11)
RDW: 15 % (ref 11.5–15.5)
WBC: 7 10*3/uL (ref 4.0–10.5)

## 2013-08-14 LAB — POCT SEDIMENTATION RATE: POCT SED RATE: 35 mm/hr — AB (ref 0–22)

## 2013-08-14 LAB — POCT GLYCOSYLATED HEMOGLOBIN (HGB A1C): Hemoglobin A1C: 5

## 2013-08-14 MED ORDER — MELOXICAM 15 MG PO TABS: 15.0000 mg | ORAL_TABLET | Freq: Every day | ORAL | Status: DC

## 2013-08-14 MED ORDER — LISINOPRIL 40 MG PO TABS: 40.0000 mg | ORAL_TABLET | Freq: Every day | ORAL | Status: DC

## 2013-08-14 MED ORDER — TRAMADOL HCL 50 MG PO TABS: 50.0000 mg | ORAL_TABLET | Freq: Three times a day (TID) | ORAL | Status: DC | PRN

## 2013-08-14 MED ORDER — PROMETHAZINE HCL 12.5 MG PO TABS: 12.5000 mg | ORAL_TABLET | Freq: Four times a day (QID) | ORAL | Status: DC | PRN

## 2013-08-14 NOTE — Progress Notes (Signed)
  Patient name: Sherry Jimenez MRN [id number]  Date of birth: 10/19/1980  CC & HPI:  Sherry Jimenez is a 33 y.o. female presenting today for ED follow-up.   Joint Pain - She reports b/l wrist and PCP pain/stiffness last ~ 2-3 hrs in the morning for several weeks - Some joint swelling "some of my rings don't fit any more"  - Weight loss: Reports ~ 20lbs in past several months, but says this is intentional due to low cal diet  - Reports dry mouth and when questioned dry -gritty eyes (I just though my make-up was getting in my eyes) so I stopped wearing make-up but it has improved - Hoarseness, but recent worsening of allergies - Denies any fevers - Unsure and FHx of rheumatological diseases as she is adopted    ROS: See HPI above otherwise negative.  Medical & Surgical Hx:  Reviewed.  Medications & Allergies: Reviewed & Updated - see associated section Social History: Reviewed:   Objective Findings:  Vitals: BP 121/84  Pulse 89  Temp(Src) 99 F (37.2 C) (Oral)  Wt 209 lb (94.802 kg)  Gen: NAD Face: No rash or conjunctival erythema  Throat: Thyroid not palpable, No cervical adenopathy  CV: RRR w/o m/r/g, both UE & LE pulses +2 b/l Resp: CTAB w/ normal respiratory effort Hands: No swelling of wrist or PIP/DIP joint; Unable to assess finger nails due to fake nails, No calcinosis  LE: No edema Skin: Mild darkening on forearms at area of reported recent rash  Assessment & Plan:   Please See Problem Focused Assessment & Plan

## 2013-08-14 NOTE — Patient Instructions (Signed)
It was great seeing you today.   1. I will check some blood work today to further evaluate your symptoms. I will call you with the results and talk about the next steps.  2. In the meantime you should take Mobic every night 3. You may also use Ultram as needed for pain and phenergan if the ultram causes nausea.    Please bring all your medications to every doctors visit  Sign up for My Chart to have easy access to your labs results, and communication with your Primary care physician.  I look forward to talking with you again at our next visit. If you have any questions or concerns before then, please call the clinic at [phone].  Take Care,   Dr Wenda LowJames Angelyse Heslin

## 2013-08-14 NOTE — Assessment & Plan Note (Signed)
Pertinent S&O  Morning Wrist and PCP pain and stiffness that last for ~ 2-3 hrs  Raynaud's phenomenon  Weight loss (likely intentional)   Dry eyes & mouth  Assessment  Concerning for Secondary Raynaud's due to possible connective tissue disorder (RA, Lupus, CREST, Sjogren/sicca)   TSH recently wnl Plan  Check CBC, Vit D due to history of anemia and hypovit D  Check ANA, Rheum Factor, CCP, SSA/SSB, ESR & CRP

## 2013-08-15 ENCOUNTER — Telehealth: Payer: Self-pay | Admitting: Family Medicine

## 2013-08-15 LAB — ANA: Anti Nuclear Antibody(ANA): NEGATIVE

## 2013-08-15 LAB — RHEUMATOID FACTOR

## 2013-08-15 LAB — SJOGREN'S SYNDROME ANTIBODS(SSA + SSB)
SSA (Ro) (ENA) Antibody, IgG: <1
SSB (La) (ENA) Antibody, IgG: <1

## 2013-08-15 LAB — VITAMIN B12: Vitamin B-12: 459 pg/mL (ref 211–911)

## 2013-08-15 LAB — CYCLIC CITRUL PEPTIDE ANTIBODY, IGG

## 2013-08-15 LAB — VITAMIN D 25 HYDROXY (VIT D DEFICIENCY, FRACTURES): VIT D 25 HYDROXY: 12 ng/mL — AB (ref 30–89)

## 2013-08-15 LAB — C-REACTIVE PROTEIN: CRP: 1.8 mg/dL — ABNORMAL HIGH (ref <0.60)

## 2013-08-15 NOTE — Telephone Encounter (Signed)
Would like lab results

## 2013-08-20 ENCOUNTER — Encounter: Payer: Self-pay | Admitting: Family Medicine

## 2013-08-20 ENCOUNTER — Telehealth: Payer: Self-pay | Admitting: Family Medicine

## 2013-08-20 NOTE — Telephone Encounter (Signed)
Pt called and would like someone to call her with her test results. jw °

## 2013-08-23 ENCOUNTER — Other Ambulatory Visit: Payer: Self-pay | Admitting: Family Medicine

## 2013-08-23 DIAGNOSIS — M25539 Pain in unspecified wrist: Secondary | ICD-10-CM

## 2013-08-23 NOTE — Progress Notes (Signed)
Called pt to discuss rheum lab results. She is still experiencing joint pain (wrist, knees, MCPs previous described). Ultram helping but still reports significant pain. Advised taking Tylenol 1043m TID, and continuing Ultram and Mobic.  - ESR and CRP elevated but Antibodies neg - Discussed Capsicin cream.  - Referred to Rheum - Asked her to call back in several days to report on pain with adding tylenol

## 2013-08-30 ENCOUNTER — Other Ambulatory Visit: Payer: Self-pay | Admitting: Family Medicine

## 2013-08-30 NOTE — Telephone Encounter (Signed)
Patient also needs refill on Tramadol. States she is completely out and her pain level is a 15 right now.

## 2013-08-31 ENCOUNTER — Other Ambulatory Visit: Payer: Self-pay | Admitting: *Deleted

## 2013-08-31 ENCOUNTER — Telehealth: Payer: Self-pay | Admitting: Family Medicine

## 2013-08-31 NOTE — Telephone Encounter (Signed)
Pt called and needs refills on her Zofran and Tramadol. If you have any questions please call [phone] after 1:00 today. jw

## 2013-09-01 ENCOUNTER — Other Ambulatory Visit: Payer: Self-pay | Admitting: Family Medicine

## 2013-09-01 DIAGNOSIS — M2550 Pain in unspecified joint: Secondary | ICD-10-CM

## 2013-09-01 MED ORDER — TRAMADOL HCL 50 MG PO TABS: 50.0000 mg | ORAL_TABLET | Freq: Three times a day (TID) | ORAL | Status: DC | PRN

## 2013-09-01 NOTE — Progress Notes (Signed)
Called and Left VM telling pt her Rx for Ultram and Phenergan had been called and that she should call to make a Lab appointment for future labs (ESR, CRP, CK, Hep C antibody) that I placed while waiting for Rheumatology apt.

## 2013-09-03 ENCOUNTER — Other Ambulatory Visit: Payer: Self-pay | Admitting: *Deleted

## 2013-09-04 NOTE — Telephone Encounter (Signed)
Called in Rx to Pharmacy to refill Ultram 50mg  q4hr prn #120. Called patient and left message.

## 2013-09-07 ENCOUNTER — Telehealth: Payer: Self-pay | Admitting: Family Medicine

## 2013-09-07 NOTE — Telephone Encounter (Signed)
Pt called and would like a refill on her Meloxicam  Called in. jw

## 2013-09-07 NOTE — Telephone Encounter (Signed)
Will forward to PCP 

## 2013-09-10 ENCOUNTER — Other Ambulatory Visit: Payer: Self-pay

## 2013-09-10 ENCOUNTER — Other Ambulatory Visit: Payer: Self-pay | Admitting: Family Medicine

## 2013-09-10 DIAGNOSIS — M2550 Pain in unspecified joint: Secondary | ICD-10-CM

## 2013-09-10 LAB — CK: CK TOTAL: 144 U/L (ref 7–177)

## 2013-09-10 LAB — C-REACTIVE PROTEIN: CRP: 0.7 mg/dL — ABNORMAL HIGH (ref <0.60)

## 2013-09-10 MED ORDER — MELOXICAM 15 MG PO TABS: 15.0000 mg | ORAL_TABLET | Freq: Every day | ORAL | Status: DC

## 2013-09-10 NOTE — Progress Notes (Signed)
HEP C AB ,CK,ESR AND CRP DONE TODAY Sherry Jimenez

## 2013-09-11 LAB — HEPATITIS C ANTIBODY, REFLEX: HCV AB: NEGATIVE

## 2013-09-11 LAB — SEDIMENTATION RATE: SED RATE: 12 mm/h (ref 0–22)

## 2013-11-20 ENCOUNTER — Other Ambulatory Visit: Payer: Self-pay | Admitting: Family Medicine

## 2013-11-20 MED ORDER — LISINOPRIL 40 MG PO TABS: 40.0000 mg | ORAL_TABLET | Freq: Every day | ORAL | Status: DC

## 2014-02-14 ENCOUNTER — Telehealth: Payer: Self-pay | Admitting: Family Medicine

## 2014-02-14 DIAGNOSIS — M1990 Unspecified osteoarthritis, unspecified site: Secondary | ICD-10-CM

## 2014-02-14 NOTE — Telephone Encounter (Signed)
Will forward to MD to see if he will place this referral. Javier Mamone,CMA

## 2014-02-14 NOTE — Telephone Encounter (Addendum)
[place], but have not established care with another provider as of yet.  [facility] and as referred to a rheumatolgist there, but they need a referral from your primary.  Ms. Sherry PinesStockton want to ask if Dr. Gayla DossJoyner would send in a referral to them for this appt. Please call [phone].

## 2014-02-18 NOTE — Telephone Encounter (Signed)
No referral has been placed yet.  It has been 5 months since last referral for rheumatology placed.  Will send to MD to place this referral for patient and then [facility]. [facility] Hartsell,CMA

## 2014-02-18 NOTE — Telephone Encounter (Signed)
Pt called to check on the status of her referral. She does not have insurance but [place]. Can we send the referral to a doctor there so that it would be covered. jw

## 2014-02-25 ENCOUNTER — Telehealth: Payer: Self-pay | Admitting: *Deleted

## 2014-02-25 NOTE — Telephone Encounter (Signed)
Pt called and made an appointment for 11/2 with Dr. Gayla DossJoyner to discuss referral . jw

## 2014-02-25 NOTE — Telephone Encounter (Signed)
LM for patient to call back.  Need to know if there is a specific MD on letter that patient needs to see before sending referral to them.  Also want to know if there is a specific date or time she would like for appt. Blen Ransome,CMA

## 2014-03-08 ENCOUNTER — Encounter: Payer: Self-pay | Admitting: Family Medicine

## 2014-03-08 ENCOUNTER — Ambulatory Visit (HOSPITAL_COMMUNITY)
Admission: RE | Admit: 2014-03-08 | Discharge: 2014-03-08 | Disposition: A | Discharge status: Home / Self Care | Payer: Self-pay | Source: Ambulatory Visit | Attending: Family Medicine | Admitting: Family Medicine

## 2014-03-08 ENCOUNTER — Ambulatory Visit (INDEPENDENT_AMBULATORY_CARE_PROVIDER_SITE_OTHER): Payer: Self-pay | Admitting: Family Medicine

## 2014-03-08 VITALS — BP 164/100 | HR 111 | Temp 99.0°F | Wt 207.0 lb

## 2014-03-08 DIAGNOSIS — R000 Tachycardia, unspecified: Secondary | ICD-10-CM | POA: Insufficient documentation

## 2014-03-08 DIAGNOSIS — R071 Chest pain on breathing: Secondary | ICD-10-CM

## 2014-03-08 DIAGNOSIS — R079 Chest pain, unspecified: Secondary | ICD-10-CM | POA: Insufficient documentation

## 2014-03-08 DIAGNOSIS — R0789 Other chest pain: Secondary | ICD-10-CM

## 2014-03-08 DIAGNOSIS — R509 Fever, unspecified: Secondary | ICD-10-CM | POA: Insufficient documentation

## 2014-03-08 LAB — CBC WITH DIFFERENTIAL/PLATELET
BASOS ABS: 0 10*3/uL (ref 0.0–0.1)
Basophils Relative: 0 % (ref 0–1)
EOS PCT: 1 % (ref 0–5)
Eosinophils Absolute: 0.1 10*3/uL (ref 0.0–0.7)
HCT: 33.9 % — ABNORMAL LOW (ref 36.0–46.0)
Hemoglobin: 10.9 g/dL — ABNORMAL LOW (ref 12.0–15.0)
Lymphocytes Relative: 14 % (ref 12–46)
Lymphs Abs: 1.7 10*3/uL (ref 0.7–4.0)
MCH: 25 pg — ABNORMAL LOW (ref 26.0–34.0)
MCHC: 32.2 g/dL (ref 30.0–36.0)
MCV: 77.8 fL — AB (ref 78.0–100.0)
Monocytes Absolute: 1.1 10*3/uL — ABNORMAL HIGH (ref 0.1–1.0)
Monocytes Relative: 9 % (ref 3–12)
NEUTROS ABS: 9 10*3/uL — AB (ref 1.7–7.7)
Neutrophils Relative %: 76 % (ref 43–77)
PLATELETS: 241 10*3/uL (ref 150–400)
RBC: 4.36 MIL/uL (ref 3.87–5.11)
RDW: 15.4 % (ref 11.5–15.5)
WBC: 11.8 10*3/uL — AB (ref 4.0–10.5)

## 2014-03-08 LAB — COMPREHENSIVE METABOLIC PANEL
ALK PHOS: 85 U/L (ref 39–117)
ALT: 12 U/L (ref 0–35)
AST: 14 U/L (ref 0–37)
Albumin: 3.8 g/dL (ref 3.5–5.2)
BUN: 5 mg/dL — ABNORMAL LOW (ref 6–23)
CHLORIDE: 100 meq/L (ref 96–112)
CO2: 23 mEq/L (ref 19–32)
CREATININE: 0.51 mg/dL (ref 0.50–1.10)
Calcium: 9.2 mg/dL (ref 8.4–10.5)
Glucose, Bld: 88 mg/dL (ref 70–99)
Potassium: 3.5 mEq/L (ref 3.5–5.3)
Sodium: 138 mEq/L (ref 135–145)
Total Bilirubin: 1.3 mg/dL — ABNORMAL HIGH (ref 0.2–1.2)
Total Protein: 7.1 g/dL (ref 6.0–8.3)

## 2014-03-08 MED ORDER — LEVOFLOXACIN 500 MG PO TABS: 500.0000 mg | ORAL_TABLET | Freq: Every day | ORAL | Status: DC

## 2014-03-08 MED ORDER — AZITHROMYCIN 250 MG PO TABS: ORAL_TABLET | ORAL | Status: DC

## 2014-03-08 NOTE — Assessment & Plan Note (Addendum)
Patient here with atypical chest pain With fever I suspect pneumonia, 2 view chest x-ray today Check EKG as she does have some positional exacerbation, checking for pericarditis Some tachycardia but [place] 1.5 so PE unlikely. Presumptively treat for pneumonia, at this point I would be convinced of atypical pneumonia even if lobar pneumonia is not shown on chest x-ray. Azithromycin 5 days

## 2014-03-08 NOTE — Patient Instructions (Signed)
Great to meet you!  PLease go get your chest xray  I will go ahead and treat you for pneumonia, please keep your appointment with Dr. Gayla DossJoyner next week.   Chest Pain (Nonspecific) It is often hard to give a specific diagnosis for the cause of chest pain. There is always a chance that your pain could be related to something serious, such as a heart attack or a blood clot in the lungs. You need to follow up with your health care provider for further evaluation. CAUSES   Heartburn.  Pneumonia or bronchitis.  Anxiety or stress.  Inflammation around your heart (pericarditis) or lung (pleuritis or pleurisy).  A blood clot in the lung.  A collapsed lung (pneumothorax). It can develop suddenly on its own (spontaneous pneumothorax) or from trauma to the chest.  Shingles infection (herpes zoster virus). The chest wall is composed of bones, muscles, and cartilage. Any of these can be the source of the pain.  The bones can be bruised by injury.  The muscles or cartilage can be strained by coughing or overwork.  The cartilage can be affected by inflammation and become sore (costochondritis). DIAGNOSIS  Lab tests or other studies may be needed to find the cause of your pain. Your health care provider may have you take a test called an ambulatory electrocardiogram (ECG). An ECG records your heartbeat patterns over a 24-hour period. You may also have other tests, such as:  Transthoracic echocardiogram (TTE). During echocardiography, sound waves are used to evaluate how blood flows through your heart.  Transesophageal echocardiogram (TEE).  Cardiac monitoring. This allows your health care provider to monitor your heart rate and rhythm in real time.  Holter monitor. This is a portable device that records your heartbeat and can help diagnose heart arrhythmias. It allows your health care provider to track your heart activity for several days, if needed.  Stress tests by exercise or by giving  medicine that makes the heart beat faster. TREATMENT   Treatment depends on what may be causing your chest pain. Treatment may include:  Acid blockers for heartburn.  Anti-inflammatory medicine.  Pain medicine for inflammatory conditions.  Antibiotics if an infection is present.  You may be advised to change lifestyle habits. This includes stopping smoking and avoiding alcohol, caffeine, and chocolate.  You may be advised to keep your head raised (elevated) when sleeping. This reduces the chance of acid going backward from your stomach into your esophagus. Most of the time, nonspecific chest pain will improve within 2-3 days with rest and mild pain medicine.  HOME CARE INSTRUCTIONS   If antibiotics were prescribed, take them as directed. Finish them even if you start to feel better.  For the next few days, avoid physical activities that bring on chest pain. Continue physical activities as directed.  Do not use any tobacco products, including cigarettes, chewing tobacco, or electronic cigarettes.  Avoid drinking alcohol.  Only take medicine as directed by your health care provider.  Follow your health care provider's suggestions for further testing if your chest pain does not go away.  Keep any follow-up appointments you made. If you do not go to an appointment, you could develop lasting (chronic) problems with pain. If there is any problem keeping an appointment, call to reschedule. SEEK MEDICAL CARE IF:   Your chest pain does not go away, even after treatment.  You have a rash with blisters on your chest.  You have a fever. SEEK IMMEDIATE MEDICAL CARE IF:  You have increased chest pain or pain that spreads to your arm, neck, jaw, back, or abdomen.  You have shortness of breath.  You have an increasing cough, or you cough up blood.  You have severe back or abdominal pain.  You feel nauseous or vomit.  You have severe weakness.  You faint.  You have  chills. This is an emergency. Do not wait to see if the pain will go away. Get medical help at once. Call your local emergency services (911 in [place] Do not drive yourself to the hospital. MAKE SURE YOU:   Understand these instructions.  Will watch your condition.  Will get help right away if you are not doing well or get worse. Document Released: 02/03/2005 Document Revised: 05/01/2013 Document Reviewed: 11/30/2007 [facility] Patient Information 2015 [place], [place]. This information is not intended to replace advice given to you by your health care provider. Make sure you discuss any questions you have with your health care provider.

## 2014-03-08 NOTE — Progress Notes (Addendum)
Patient ID: Sherry QuinonesShanita S Jimenez, female   DOB: 08/23/1980, 33 y.o.   MRN: [id number]   HPI  Patient presents today for same-day appointment for cough, fever, chest pain  Patient states that for several weeks she's had joint aches and pains including her bilateral legs knees, hands.  For about one week she's had a fever ranging from 99-101. She was seen in the ER about 2 weeks ago where she had a full workup including negative chest x-ray, normal d-dimer, and normal telemetry.  She states that today she has a infrequent dry cough continues fever for the last 5 days, continued joint pains, midsternal pressure-like chest pain that radiates to the left shoulder. She's tried Modic, Tylenol, TheraFlu, Sudafed without any improvement.  She states that the pain in her chest and left shoulder get worse with deep inspiration or movement of her left shoulder. She states that the pain gets worse if she leans forward.  She denies history of blood clot, estrogen use, recent immobilization, or recent surgery.  Smoking status noted ROS: Per HPI  Objective: BP 164/100  Pulse 111  Temp(Src) 99 F (37.2 C) (Oral)  Wt 207 lb (93.895 kg)  SpO2 98%  LMP 02/12/2014 Gen: NAD, alert, cooperative with exam HEENT: NCAT CV: RRR, good S1/S2, no murmur Resp: CTABL, no wheezes, non-labored Ext: No edema, warm, 42 cm in circumference bilaterally Neuro: Alert and oriented, No gross deficits  EKG 03/08/2014: Heart rate 109, sinus tachycardia, no apparent ST segment irregularities, good R-wave progression, questionable criteria for LVH  Assessment and plan:  Chest pain Patient here with atypical chest pain With fever I suspect pneumonia, 2 view chest x-ray today Check EKG as she does have some positional exacerbation, checking for pericarditis   Fever With chest pain suspect acute URI, flu, or pneumonia Chest x-ray today Symptomatic treatment   Orders Placed This Encounter  Procedures  . DG Chest  2 View    Standing Status: Future     Number of Occurrences:      Standing Expiration Date: 05/09/2015    Order Specific Question:  Reason for Exam (SYMPTOM  OR DIAGNOSIS REQUIRED)    Answer:  pleuritic chest pain    Order Specific Question:  Is the patient pregnant?    Answer:  No    Order Specific Question:  Preferred imaging location?    [facility]  . CBC with Differential  . Comprehensive metabolic panel    Meds ordered this encounter  Medications  . DISCONTD: azithromycin (ZITHROMAX) 250 MG tablet    Sig: Take 2 tabs on day 1 and 1 tab per day until gone after that    Dispense:  6 tablet    Refill:  0  . levofloxacin (LEVAQUIN) 500 MG tablet    Sig: Take 1 tablet (500 mg total) by mouth daily.    Dispense:  7 tablet    Refill:  0

## 2014-03-08 NOTE — Assessment & Plan Note (Signed)
With chest pain suspect acute URI, flu, or pneumonia Chest x-ray today Symptomatic treatment

## 2014-03-11 ENCOUNTER — Ambulatory Visit (INDEPENDENT_AMBULATORY_CARE_PROVIDER_SITE_OTHER): Payer: Self-pay | Admitting: Family Medicine

## 2014-03-11 ENCOUNTER — Encounter: Payer: Self-pay | Admitting: Family Medicine

## 2014-03-11 VITALS — BP 120/84 | HR 88 | Temp 98.7°F | Resp 22 | Wt 206.0 lb

## 2014-03-11 DIAGNOSIS — D509 Iron deficiency anemia, unspecified: Secondary | ICD-10-CM

## 2014-03-11 DIAGNOSIS — M2550 Pain in unspecified joint: Secondary | ICD-10-CM

## 2014-03-11 MED ORDER — AMITRIPTYLINE HCL 10 MG PO TABS: 10.0000 mg | ORAL_TABLET | Freq: Every evening | ORAL | Status: DC | PRN

## 2014-03-11 MED ORDER — PROMETHAZINE HCL 12.5 MG PO TABS: ORAL_TABLET | ORAL | Status: DC

## 2014-03-11 MED ORDER — GABAPENTIN 100 MG PO CAPS: 300.0000 mg | ORAL_CAPSULE | Freq: Three times a day (TID) | ORAL | Status: DC

## 2014-03-11 NOTE — Assessment & Plan Note (Signed)
Still anemic but doesn't want to start PO iron due to GI side-effects  - Discuss at next visit

## 2014-03-11 NOTE — Patient Instructions (Signed)
It was great seeing you today.   1. Start Amitriptyline at night: take 10 mg (1 pill) for the first two weeks then increase by 5 mg (1/2 pill) every two weeks until at 25mg  (2 & 1/2 pills) nightly 2. Inc Gabapentin: Take 300 mg on day one; 300 mg twice a day on day two; 300 mg three times a day on day 3 and stay at that dose until next visit   Please bring all your medications to every doctors visit  Sign up for My Chart to have easy access to your labs results, and communication with your Primary care physician.  Next Appointment  Please call to make an appointment with Dr Gayla DossJoyner in 2-4 weeks   I look forward to talking with you again at our next visit. If you have any questions or concerns before then, please call the clinic at [phone].  Take Care,   Dr Wenda LowJames Fain Francis

## 2014-03-11 NOTE — Progress Notes (Signed)
  Patient name: Sherry Jimenez MRN [id number]  Date of birth: 07/28/1980  CC & HPI:  Sherry Jimenez is a 33 y.o. female presenting today for pain. She reports ongoing pain in her wrist and fingers b/l as well as pain in her feet, knees, and hips b/l. She describes the pain as burning. She denies swelling or redness. Reports some weakness related to pain. Additionally she reports fatigue, mental "fog", sleep difficulties and HAs, TMJ pain, urinary frequency and bloating/gas. Denies any weight loss, fevers, chills.   ROS: See HPI   Medical & Surgical Hx:  Reviewed  Medications & Allergies: Reviewed  Social History: Reviewed:   Objective Findings:  Vitals: BP 120/84 mmHg  Pulse 88  Temp(Src) 98.7 F (37.1 C) (Oral)  Resp 22  Wt 206 lb (93.441 kg)  LMP 02/12/2014  Gen: NAD CV: RRR w/o m/r/g, pulses +2 b/l Resp: CTAB w/ normal respiratory effort MSK: Tenderness to palpation in hands, wrist, hips and knees, but no swelling, erythema. Full ROM and strength equal bilaterally in hands  Assessment & Plan:   Please See Problem Focused Assessment & Plan

## 2014-03-11 NOTE — Assessment & Plan Note (Signed)
Previous work-up for rheumatological pathology negative; Weight stable. No signs of inflammation on exam. Bilateral pain above and below wrist with sleep difficulties and regional pain syndromes: TMJ, IBS, Cystitis consistent with fibromylagia.  - She has not seen a rhuematolgist due to lack of insurance; She will call to ask about out of pocket cost - Start Elavil 10 mg qhs and titrate up 5 mg every two weeks to 25 mg - Inc Gabapentin to 300 mg TID - Reassess in 2-3 weeks - Consider repeating ESR if signs of inflammation - Advised exercise daily; and continuing mobic and ultram until titrated up TCA and gaba

## 2014-04-08 ENCOUNTER — Ambulatory Visit: Payer: Self-pay | Admitting: Family Medicine

## 2014-04-16 ENCOUNTER — Other Ambulatory Visit: Payer: Self-pay | Admitting: *Deleted

## 2014-04-16 NOTE — Telephone Encounter (Signed)
Insurance requires a 90 day supply.  Lincy Belles L, RN  

## 2014-04-17 MED ORDER — GABAPENTIN 100 MG PO CAPS: 300.0000 mg | ORAL_CAPSULE | Freq: Three times a day (TID) | ORAL | Status: DC

## 2014-04-17 MED ORDER — AMITRIPTYLINE HCL 10 MG PO TABS: 10.0000 mg | ORAL_TABLET | Freq: Every evening | ORAL | Status: DC | PRN

## 2014-05-09 ENCOUNTER — Other Ambulatory Visit: Payer: Self-pay | Admitting: Family Medicine

## 2014-05-23 ENCOUNTER — Other Ambulatory Visit: Payer: Self-pay | Admitting: Family Medicine

## 2014-05-23 DIAGNOSIS — M2550 Pain in unspecified joint: Secondary | ICD-10-CM

## 2014-05-23 NOTE — Telephone Encounter (Signed)
Would like refills on amitriptyline and tramadol Could pick up rx for tramadol tomorrow  Please call when ready

## 2014-05-24 ENCOUNTER — Telehealth: Payer: Self-pay | Admitting: *Deleted

## 2014-05-24 ENCOUNTER — Telehealth: Payer: Self-pay | Admitting: Family Medicine

## 2014-05-24 DIAGNOSIS — M2550 Pain in unspecified joint: Secondary | ICD-10-CM

## 2014-05-24 MED ORDER — AMITRIPTYLINE HCL 25 MG PO TABS: 25.0000 mg | ORAL_TABLET | Freq: Every evening | ORAL | Status: DC | PRN

## 2014-05-24 MED ORDER — TRAMADOL HCL 50 MG PO TABS: 50.0000 mg | ORAL_TABLET | Freq: Three times a day (TID) | ORAL | Status: DC | PRN

## 2014-05-24 NOTE — Telephone Encounter (Signed)
Will check with Md to see if tramadol was called into pharmacy.  This medication can not be sent electronically. Jermiya Reichl,CMA

## 2014-05-24 NOTE — Telephone Encounter (Signed)
Received a message from [facility] needing clarification in pt's amitriptyline directions.  Please give them a call at [phone]. Clovis PuMartin, Tamika L, RN

## 2014-05-24 NOTE — Telephone Encounter (Signed)
Clarification given to pharmacy. Sherry Jimenez,CMA

## 2014-05-25 ENCOUNTER — Other Ambulatory Visit: Payer: Self-pay | Admitting: Family Medicine

## 2014-06-12 ENCOUNTER — Other Ambulatory Visit: Payer: Self-pay | Admitting: *Deleted

## 2014-06-12 DIAGNOSIS — M2550 Pain in unspecified joint: Secondary | ICD-10-CM

## 2014-06-12 NOTE — Telephone Encounter (Signed)
Please send a 90 day supply.  Pt does not have any insurance.  Clovis PuMartin, Shaka Cardin L, RN

## 2014-06-13 MED ORDER — TRAMADOL HCL 50 MG PO TABS: 50.0000 mg | ORAL_TABLET | Freq: Three times a day (TID) | ORAL | Status: DC | PRN

## 2014-06-13 NOTE — Telephone Encounter (Signed)
Called in Ultram refill. #90 Refill 1. She would need to be seen before additional refills.

## 2014-06-13 NOTE — Telephone Encounter (Signed)
LM for patient.  Medication called into pharmacy and please keep appointment scheduled. Jazmin Hartsell,CMA

## 2014-06-18 ENCOUNTER — Ambulatory Visit: Payer: Self-pay | Admitting: Family Medicine

## 2014-07-08 ENCOUNTER — Encounter: Payer: Self-pay | Admitting: Family Medicine

## 2014-07-08 ENCOUNTER — Ambulatory Visit (INDEPENDENT_AMBULATORY_CARE_PROVIDER_SITE_OTHER): Payer: Self-pay | Admitting: Family Medicine

## 2014-07-08 VITALS — BP 157/109 | HR 81 | Temp 98.4°F | Wt 231.0 lb

## 2014-07-08 DIAGNOSIS — H1013 Acute atopic conjunctivitis, bilateral: Secondary | ICD-10-CM

## 2014-07-08 DIAGNOSIS — I10 Essential (primary) hypertension: Secondary | ICD-10-CM

## 2014-07-08 DIAGNOSIS — E559 Vitamin D deficiency, unspecified: Secondary | ICD-10-CM

## 2014-07-08 DIAGNOSIS — M2550 Pain in unspecified joint: Secondary | ICD-10-CM

## 2014-07-08 DIAGNOSIS — R0981 Nasal congestion: Secondary | ICD-10-CM | POA: Insufficient documentation

## 2014-07-08 MED ORDER — FLUTICASONE PROPIONATE 50 MCG/ACT NA SUSP: 2.0000 | Freq: Every day | NASAL | Status: DC

## 2014-07-08 MED ORDER — CETIRIZINE HCL 10 MG PO TABS: 10.0000 mg | ORAL_TABLET | Freq: Every day | ORAL | Status: DC

## 2014-07-08 NOTE — Assessment & Plan Note (Addendum)
She switched back to ACE-I due to cost. Denies current cough - Switched to E-cigs - BP elevated today - Stop mobic as having questionable benefit anyway  - She will check her BP for the next week and call with numbers - If BP remains elevated will likely start HCTZ - Will discuss birth control when she calls. She is considering getting pregnant again, if she wishes to do this her ACE-I will need to be stopped and risk of pregnancy with hypertension should be discussed

## 2014-07-08 NOTE — Progress Notes (Signed)
  Patient name: Sherry Jimenez MRN [id number]  Date of birth: 09/08/1980  CC & HPI:  Sherry Jimenez is a 34 y.o. female presenting today for nasal congestion, weight-gain, HTN, and fibromyalgia.   CHRONIC HYPERTENSION  BP Readings from Last 3 Encounters:  07/08/14 157/109  03/11/14 120/84  03/08/14 164/100    Disease Monitoring  Blood pressure range outside clinc: checks several times a week 120-150  Chest pain: no   Dyspnea: no   Claudication: no  Medication compliance: yes  Medication Side Effects: denies Dizziness/lightheadedness;   ACEI: (cough, angioedema,lightheadedness, rash)  Preventitive Healthcare:   History  Smoking status  . Current Some Day Smoker -- 0.25 packs/day for 12 years  Smokeless tobacco  . Not on file    Comment: electronic cigarette   Nasal congestion - reports nasal congestion, post-nasal drip, throat irration and watery eyes x 2 weeks - denies fevers, chills, cough - Recently started new job as custodian  - history of allergies w/o asthma  Fibromyalgia - Reports much improved fatigue, mood, and pain since starting elavil; however complains of dry mouth and weight gain - Wishes to switch medications due to side-effects - Continues to take mobic with questionable benefit. Stopped and restart after starting new job - Continues to take ultram TID prn for pain with improved daily functioning -able to work and be active with her 6 kids.   ROS: See HPI   Medications & Allergies: Reviewed  Social History: Reviewed:   Objective Findings:  Vitals: BP 157/109 mmHg  Pulse 81  Temp(Src) 98.4 F (36.9 C) (Oral)  Wt 231 lb (104.781 kg)  LMP 07/05/2014  Gen: NAD CV: RRR w/o m/r/g, pulses +2 b/l Resp: CTAB w/ normal respiratory effort  Assessment & Plan:   Please See Problem Focused Assessment & Plan

## 2014-07-08 NOTE — Assessment & Plan Note (Signed)
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

## 2014-07-08 NOTE — Assessment & Plan Note (Signed)
Symptoms well controlled with Elavil, Gabapentin, Mobic and Ultram - Stop Elavil 2/2 dry mouth, weigh gain - Consider starting Cymbalta but BP elevated today; Likely start if BP better controlled off mobic, if not consider Celexa - Stop mobic due to HTN and questionable benefit - Continue Ultram as improved daily functioning

## 2014-07-08 NOTE — Assessment & Plan Note (Signed)
Encouraged her to continue taking Vit D 800u daily  - Will recheck Vit D when need more blood work

## 2014-07-09 ENCOUNTER — Other Ambulatory Visit: Payer: Self-pay | Admitting: Family Medicine

## 2014-07-09 ENCOUNTER — Telehealth: Payer: Self-pay | Admitting: Family Medicine

## 2014-07-09 DIAGNOSIS — Z3189 Encounter for other procreative management: Secondary | ICD-10-CM

## 2014-07-09 NOTE — Telephone Encounter (Signed)
Will forward to MD. Sherry Jimenez,CMA  

## 2014-07-09 NOTE — Progress Notes (Signed)
Called patient. She is currently interested in getting pregnant. Developed high BP during last pregnancy without other problems. HTN never resolved after birth. She is currently on ACE-I; recommended not getting pregnant until able to discuss risk of pregnancy and HTN with Ob/Gyn and able to get BP under control with medication other than ACE-I.

## 2014-07-09 NOTE — Telephone Encounter (Signed)
Pt is returning a call to Dr. Gayla DossJoyner, says the message was broken up and she couldn't understand it

## 2014-07-22 ENCOUNTER — Telehealth: Payer: Self-pay | Admitting: Family Medicine

## 2014-07-22 NOTE — Telephone Encounter (Signed)
Wanting PCP to return her call regarding medication and headaches / sr

## 2014-07-23 NOTE — Telephone Encounter (Signed)
Spoke with pt and advised her that PCP is on vacation this week. She stated that she can wait until appt with PCP on 07/29/14 to address concerns. She stated that she had death in her last week and BP has been elevated some and had some HA's as well and not sure if it maybe related to death in family. She stated that she was not sure if she needed to take diuretic in evening as discussed at last visit. Jaron Czarnecki, CMA.

## 2014-07-29 ENCOUNTER — Ambulatory Visit: Payer: Self-pay | Admitting: Family Medicine

## 2014-07-31 ENCOUNTER — Other Ambulatory Visit: Payer: Self-pay | Admitting: Family Medicine

## 2014-07-31 DIAGNOSIS — Z3189 Encounter for other procreative management: Secondary | ICD-10-CM | POA: Insufficient documentation

## 2014-08-17 ENCOUNTER — Other Ambulatory Visit: Payer: Self-pay | Admitting: Family Medicine

## 2014-08-17 DIAGNOSIS — I10 Essential (primary) hypertension: Secondary | ICD-10-CM

## 2014-08-21 NOTE — Telephone Encounter (Signed)
Called and left VM. She requested refill on lisinopril; however last visit she said she wanted to get pregnant and we talked about her not being able to take Lisinopril while pregnant. If she is not currently trying to get pregnant I will refill the medication for her. IF she is trying I will need to call in a new medication and then see her in 1 month to check BP and discuss further.

## 2014-08-21 NOTE — Telephone Encounter (Signed)
Spoke with patient and she would like to continue the lisinopril until her appt with you on 09/26/2014.  She is currently not trying due to having a lot going on with the recent death of her father. Henning Ehle,CMA

## 2014-08-21 NOTE — Telephone Encounter (Signed)
Sent in Rx for lisinopril. She should use condoms for birth control until we can discuss other blood pressure medication options. Or if she has used a previous birth control that she would like me to reorder just let me know.

## 2014-08-21 NOTE — Telephone Encounter (Signed)
LM for patient to call back. Kassity Woodson,CMA  

## 2014-08-22 ENCOUNTER — Telehealth: Payer: Self-pay | Admitting: Family Medicine

## 2014-08-22 NOTE — Telephone Encounter (Signed)
Will forward to PCP for review. [name], CMA. 

## 2014-08-22 NOTE — Telephone Encounter (Signed)
Please call. Unlikely that another nasal spray would work better but she is welcome to try any of the OTC nasal sprays - they all work about the same. Instead she could try using nasal saline several times a day (3-4) and / or increasing Flonase to BID dosing for next 1-2 weeks during pollen season. Instruct her to aim the spray towards her ear on the same side as each nostril; spraying towards the midline is less effective.

## 2014-08-22 NOTE — Telephone Encounter (Signed)
Pt called and would like the doctor to call in another type of allergy medication since the nasal spray is not working as well as it should. jw

## 2014-08-23 NOTE — Telephone Encounter (Signed)
Spoke with pt.   She will increase the flonase and pick up a neti pot.  She also called the pharmacy and had refills on her claritin, she is going to pick this up as well. Rayaan Garguilo, Maryjo RochesterJessica Dawn

## 2014-08-26 ENCOUNTER — Telehealth: Payer: Self-pay | Admitting: Family Medicine

## 2014-08-26 NOTE — Telephone Encounter (Signed)
Was told to call back if her condition worsened and "something could be called in" for her. Pt explains that she has a fever of 101.9, stomach and back pain as well as cold chills. Would like for someone to call her if something could be called in for this / Dorothey BasemanSadie Reynolds, ASA

## 2014-08-26 NOTE — Telephone Encounter (Signed)
Patient called back to make an appt for tomorrow 08/27/2014 but would like a call back if possible. Vayla Wilhelmi,CMA

## 2014-08-27 ENCOUNTER — Ambulatory Visit (INDEPENDENT_AMBULATORY_CARE_PROVIDER_SITE_OTHER): Payer: Self-pay | Admitting: Family Medicine

## 2014-08-27 ENCOUNTER — Encounter: Payer: Self-pay | Admitting: Family Medicine

## 2014-08-27 VITALS — BP 129/82 | HR 95 | Temp 99.6°F | Ht 62.0 in | Wt 222.4 lb

## 2014-08-27 DIAGNOSIS — R05 Cough: Secondary | ICD-10-CM

## 2014-08-27 DIAGNOSIS — R6889 Other general symptoms and signs: Secondary | ICD-10-CM

## 2014-08-27 DIAGNOSIS — R109 Unspecified abdominal pain: Secondary | ICD-10-CM

## 2014-08-27 DIAGNOSIS — R059 Cough, unspecified: Secondary | ICD-10-CM

## 2014-08-27 LAB — POCT UA - MICROSCOPIC ONLY

## 2014-08-27 LAB — POCT URINALYSIS DIPSTICK
Bilirubin, UA: NEGATIVE
Glucose, UA: NEGATIVE
Ketones, UA: NEGATIVE
Leukocytes, UA: NEGATIVE
Nitrite, UA: NEGATIVE
Protein, UA: NEGATIVE
Spec Grav, UA: 1.01
Urobilinogen, UA: 2
pH, UA: 6.5

## 2014-08-27 LAB — POCT URINE PREGNANCY: PREG TEST UR: NEGATIVE

## 2014-08-27 MED ORDER — HYDROCODONE-HOMATROPINE 5-1.5 MG/5ML PO SYRP: 5.0000 mL | ORAL_SOLUTION | Freq: Three times a day (TID) | ORAL | Status: DC | PRN

## 2014-08-27 NOTE — Patient Instructions (Addendum)
I think this is flu Take hycodan cough syrup every 8 hours as needed for cough Go get chest xray  I will call you or send letter with results Follow up if not getting better in next several days  Use meloxicam for your shoulder Also ice, gentle stretching Follow up with Dr. Gayla DossJoyner if not improving.  Be well, Dr. Pollie MeyerMcIntyre    Influenza Influenza ("the flu") is a viral infection of the respiratory tract. It occurs more often in winter months because people spend more time in close contact with one another. Influenza can make you feel very sick. Influenza easily spreads from person to person (contagious). CAUSES  Influenza is caused by a virus that infects the respiratory tract. You can catch the virus by breathing in droplets from an infected person's cough or sneeze. You can also catch the virus by touching something that was recently contaminated with the virus and then touching your mouth, nose, or eyes. RISKS AND COMPLICATIONS You may be at risk for a more severe case of influenza if you smoke cigarettes, have diabetes, have chronic heart disease (such as heart failure) or lung disease (such as asthma), or if you have a weakened immune system. Elderly people and pregnant women are also at risk for more serious infections. The most common problem of influenza is a lung infection (pneumonia). Sometimes, this problem can require emergency medical care and may be life threatening. SIGNS AND SYMPTOMS  Symptoms typically last 4 to 10 days and may include:  Fever.  Chills.  Headache, body aches, and muscle aches.  Sore throat.  Chest discomfort and cough.  Poor appetite.  Weakness or feeling tired.  Dizziness.  Nausea or vomiting. DIAGNOSIS  Diagnosis of influenza is often made based on your history and a physical exam. A nose or throat swab test can be done to confirm the diagnosis. TREATMENT  In mild cases, influenza goes away on its own. Treatment is directed at relieving  symptoms. For more severe cases, your health care provider may prescribe antiviral medicines to shorten the sickness. Antibiotic medicines are not effective because the infection is caused by a virus, not by bacteria. HOME CARE INSTRUCTIONS  Take medicines only as directed by your health care provider.  Use a cool mist humidifier to make breathing easier.  Get plenty of rest until your temperature returns to normal. This usually takes 3 to 4 days.  Drink enough fluid to keep your urine clear or pale yellow.  Cover yourmouth and nosewhen coughing or sneezing,and wash your handswellto prevent thevirusfrom spreading.  Stay homefromwork orschool untilthe fever is gonefor at least 621full day. PREVENTION  An annual influenza vaccination (flu shot) is the best way to avoid getting influenza. An annual flu shot is now routinely recommended for all adults in the [place] SEEK MEDICAL CARE IF:  You experiencechest pain, yourcough worsens,or you producemore mucus.  Youhave nausea,vomiting, ordiarrhea.  Your fever returns or gets worse. SEEK IMMEDIATE MEDICAL CARE IF:  You havetrouble breathing, you become short of breath,or your skin ornails becomebluish.  You have severe painor stiffnessin the neck.  You develop a sudden headache, or pain in the face or ear.  You have nausea or vomiting that you cannot control. MAKE SURE YOU:   Understand these instructions.  Will watch your condition.  Will get help right away if you are not doing well or get worse. Document Released: 04/23/2000 Document Revised: 09/10/2013 Document Reviewed: 07/26/2011 [facility] Patient Information 2015 [place] [place]. This information is not  intended to replace advice given to you by your health care provider. Make sure you discuss any questions you have with your health care provider.

## 2014-08-27 NOTE — Telephone Encounter (Signed)
Pt seen in clinic today, believed to be flu/flu like illness.

## 2014-08-28 ENCOUNTER — Telehealth: Payer: Self-pay | Admitting: Family Medicine

## 2014-08-28 NOTE — Telephone Encounter (Signed)
Letter faxed to number provided.

## 2014-08-28 NOTE — Telephone Encounter (Signed)
Patient informed, letter faxed to [phone]

## 2014-08-28 NOTE — Telephone Encounter (Signed)
Came in to see Dr. Pollie MeyerMcIntyre yesterday and was diagnosed with the flu. She never received a note for work and needs one so she knows when to return back to work. Please call the pt when it is ready / thanks HoneywellSadie Reynolds, ASA

## 2014-08-28 NOTE — Telephone Encounter (Signed)
She would like for the letter to be faxed to her place of work  Attn: Jason CoopLavern Ingram @ [phone], as it is too far away for her to come to get it today. / thanks [name], ASA

## 2014-08-28 NOTE — Progress Notes (Signed)
Patient ID: Sherry Jimenez, female   DOB: 10/04/1980, 34 y.o.   MRN: [id number]  HPI:  Pt presents for a same day appointment to discuss flulike symptoms.  This is been going on for about 5 days. She's had body aches, shortness of breath, cough. Has had fever to 101.8 at home. Has had chills and sweating as well. Has tried taking Tylenol cold and congestion, without relief. Also feels very congested in her face. Cough has been dry, nonproductive. Has been nauseated without vomiting. Has had 2 episodes of diarrhea. Also feels very gassy. Denies any dysuria. She has had some flank pain bilaterally. Has not had a flu shot this year. 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 She is currently on the last day of her period. She is tolerating by mouth intake.  Also endorses pain in her left shoulder that is worsened by inspiration. It is also worsened by certain movements.   ROS: See HPI  PMFSH: History of hypertension, vitamin D deficiency  PHYSICAL EXAM: BP 129/82 mmHg  Pulse 95  Temp(Src) 99.6 F (37.6 C) (Oral)  Ht 5\' 2"  (1.575 m)  Wt 222 lb 6.4 oz (100.88 kg)  BMI 40.67 kg/m2  SpO2 99%  LMP 08/23/2014 Gen: No acute distress, pleasant, cooperative, appears as if she does not feel well HEENT: Normocephalic, atraumatic, oropharynx clear and moist without lesions or exudate, shotty anterior lymphadenopathy bilaterally, TMs clear bilaterally, nares with some congestion. Heart: Regular rate and rhythm, no murmur Lungs: Clear to auscultation bilaterally, normal respiratory effort, no wheezes or focal crackles Abdomen: Soft, nontender to palpation, no masses or organomegaly, no peritoneal signs Neuro: Grossly nonfocal, speech normal Back: Mild flank tenderness bilaterally Left shoulder: Diminished range of motion secondary to pain. Full strength in left arm. Tenderness over the acromioclavicular joint.  ASSESSMENT/PLAN:  1.  Flulike symptoms: Suspect  this is due to influenza. Unfortunately she is out of the window for Tamiflu. I did perform a urinalysis as she is endorsing fever and back pain, and it was negative for any indications of infection. It has mild blood, but she is on the last day of her period. I will have her go and get a chest x-ray since cough has been a predominant symptom, and want to ensure she does not have pneumonia. She is not hypoxic, and is relatively well-appearing. No indication for empiric antibotics this time. We'll provide Hycodan for cough.  2. Left shoulder pain: Appears to be acromioclavicular joint related. Recommend meloxicam, ice, gentle stretching. Patient already has prescription for meloxicam at home. Follow-up with PCP if not improving.  FOLLOW UP: F/u as needed if symptoms worsen or do not improve.   GrenadaBrittany J. Pollie MeyerMcIntyre, MD [facility]

## 2014-08-28 NOTE — Telephone Encounter (Signed)
She should stay out of work for today. If she is feeling better she could go back tomorrow, but should judge based on her symptoms. Should not work until she has been afebrile for at least 24 hours. If she is still feeling bad enough to be out of work on Monday, she will need to return to be re-evaluated.  Red team, can you type up a work note with the above message for her and place at front desk, then notify patient?  Thanks, Latrelle DodrillBrittany J Sallie Staron, MD

## 2014-09-02 ENCOUNTER — Telehealth: Payer: Self-pay | Admitting: Family Medicine

## 2014-09-02 NOTE — Telephone Encounter (Signed)
Attempted to call pt to check on her, wanted to see how she's doing after last week's visit. She did not answer and I did not leave VM.  Red team, would you mind giving her a call later in the day to make sure she's getting better?  Thanks, Latrelle DodrillBrittany J Pattrick Bady, MD

## 2014-09-02 NOTE — Telephone Encounter (Signed)
Reached pt. She is not having fevers. Getting better with each day that passes. Still some sputum production early in AM. Otherwise feeling much better with improved energy. Recommend stopping cough med as she is able. Advised to let us know if she has any other issues.  Latrelle DodrillBrittany J Minah Axelrod, MD

## 2014-09-02 NOTE — Telephone Encounter (Signed)
Spoke with pt she is feeling better but still coughing up a lot of mucus. She states that this only happens in the morning and she is still taking the cough medicine. Darrah Dredge Bruna PotterBlount, CMA

## 2014-09-02 NOTE — Telephone Encounter (Signed)
Called pt back without response. If she returns call please let her know: -it can be normal to still have cough lingering -recommend she go get chest xray (she did not do this as instructed before) -if she is still having fevers we need to know about that.  Thanks! Latrelle DodrillBrittany J Assad Harbeson, MD

## 2014-09-02 NOTE — Telephone Encounter (Signed)
Attempted again to reach pt, no answer. Left VM asking her to call back. Latrelle DodrillBrittany J Lupe Handley, MD

## 2014-09-18 ENCOUNTER — Other Ambulatory Visit: Payer: Self-pay | Admitting: Family Medicine

## 2014-09-18 NOTE — Telephone Encounter (Signed)
Refill request. Will forward to PCP for review. Kariann Wecker, CMA. 

## 2014-09-26 ENCOUNTER — Encounter: Payer: Self-pay | Admitting: Family Medicine

## 2014-09-26 ENCOUNTER — Ambulatory Visit (INDEPENDENT_AMBULATORY_CARE_PROVIDER_SITE_OTHER): Payer: Self-pay | Admitting: Family Medicine

## 2014-09-26 VITALS — BP 111/66 | HR 91 | Temp 98.4°F | Ht 62.0 in | Wt 225.6 lb

## 2014-09-26 DIAGNOSIS — Z331 Pregnant state, incidental: Secondary | ICD-10-CM

## 2014-09-26 DIAGNOSIS — M2550 Pain in unspecified joint: Secondary | ICD-10-CM

## 2014-09-26 DIAGNOSIS — Z3490 Encounter for supervision of normal pregnancy, unspecified, unspecified trimester: Secondary | ICD-10-CM

## 2014-09-26 DIAGNOSIS — I10 Essential (primary) hypertension: Secondary | ICD-10-CM

## 2014-09-26 LAB — POCT URINE PREGNANCY: Preg Test, Ur: POSITIVE

## 2014-09-26 MED ORDER — AMLODIPINE BESYLATE 10 MG PO TABS: 10.0000 mg | ORAL_TABLET | Freq: Every day | ORAL | Status: DC

## 2014-09-26 NOTE — Progress Notes (Signed)
  Patient name: Sherry Jimenez MRN [id number]  Date of birth: 03/23/1981  CC & HPI:  Sherry Jimenez is a 34 y.o. female presenting today for HTN.   HTN - She is considering getting pregnant again.  Discussed stopping lisinopril - Also her husband plans on having vasectomy in September.  If they have not gotten pregnant by the time - BP well controlled on current regimen.  After stopping mobic  Fibromyalgia - She continues to have good control of her pain and improved function with Ultram - She will make appointment in 1 month to discuss other treatment options   Objective Findings:  Vitals: BP 111/66 mmHg  Pulse 91  Temp(Src) 98.4 F (36.9 C) (Oral)  Ht 5\' 2"  (1.575 m)  Wt 225 lb 9 oz (102.314 kg)  BMI 41.25 kg/m2  LMP 08/23/2014  Gen: NAD CV: RRR w/o m/r/g, pulses +2 b/l Resp: CTAB w/ normal respiratory effort  Assessment & Plan:   Please See Problem Focused Assessment & Plan

## 2014-09-26 NOTE — Assessment & Plan Note (Signed)
BP much improved after stopping mobic  - Discontinue lisinopril due to currently trying to get pregnant - Start Norvasc 10 mg daily; she will make appointment for nurse BP visit in one month - She will follow-up in September to switch back to lisinopril if not pregnant at that time

## 2014-09-26 NOTE — Assessment & Plan Note (Signed)
She continues to do well off of Modic and Elavil

## 2014-09-26 NOTE — Patient Instructions (Signed)
It was great seeing you today.   1. Stop Lisinopril and start Norvasc 10 mg daily.    Please bring all your medications to every doctors visit  Sign up for My Chart to have easy access to your labs results, and communication with your Primary care physician.  Next Appointment  Please make an appointment with Dr Gayla DossJoyner in June to discuss pain   I look forward to talking with you again at our next visit. If you have any questions or concerns before then, please call the clinic at [phone].  Take Care,   Dr Wenda LowJames Mavrick Mcquigg

## 2014-09-26 NOTE — Assessment & Plan Note (Signed)
[facility] chronic [facility]

## 2014-10-03 ENCOUNTER — Encounter: Payer: Self-pay | Admitting: Obstetrics & Gynecology

## 2014-10-15 LAB — HM PAP SMEAR: HM Pap smear: NEGATIVE

## 2014-10-17 ENCOUNTER — Encounter: Payer: Self-pay | Admitting: Obstetrics & Gynecology

## 2014-10-21 DIAGNOSIS — F1210 Cannabis abuse, uncomplicated: Secondary | ICD-10-CM | POA: Insufficient documentation

## 2014-10-23 DIAGNOSIS — I10 Essential (primary) hypertension: Secondary | ICD-10-CM | POA: Insufficient documentation

## 2014-12-11 NOTE — Telephone Encounter (Signed)
Erroneous

## 2014-12-16 ENCOUNTER — Ambulatory Visit: Payer: Self-pay | Admitting: Family Medicine

## 2014-12-31 ENCOUNTER — Other Ambulatory Visit: Payer: Self-pay | Admitting: *Deleted

## 2014-12-31 NOTE — Telephone Encounter (Signed)
Left voice message for patient to [facility] for blood pressure medication refills due to high risk pregnancy.  Clovis Pu, RN

## 2014-12-31 NOTE — Telephone Encounter (Signed)
Pt calling and would like for PCP to be informed that she is no longer pregnant. She miscarried in June and this is why she was requesting the refill. Sadie Reynolds, ASA

## 2014-12-31 NOTE — Addendum Note (Signed)
Addended by: Clovis Pu on: 12/31/2014 03:19 PM   Modules accepted: Orders

## 2014-12-31 NOTE — Telephone Encounter (Signed)
Please call. She was referred to [facility] for high-risk pregnancy due to hypertension. She needs to follow-up with them for blood pressure control / medications while pregnant

## 2015-01-01 NOTE — Telephone Encounter (Signed)
2nd request.  Martin, Tamika L, RN  

## 2015-01-02 ENCOUNTER — Telehealth: Payer: Self-pay | Admitting: *Deleted

## 2015-01-02 MED ORDER — AMLODIPINE BESYLATE 10 MG PO TABS: 10.0000 mg | ORAL_TABLET | Freq: Every day | ORAL | Status: DC

## 2015-01-02 NOTE — Telephone Encounter (Signed)
-----   Message from Jamal Collin, MD sent at 01/02/2015  8:57 AM EDT ----- Attempted to call patient but no answer voicemail box full. Please call and let her know that I'm sorry to hear about the loss of her pregnancy.  I refilled her blood pressure medication, would like to see her in the next 2-3 months for recheck of hypertension.

## 2015-01-07 NOTE — Telephone Encounter (Signed)
Letter mailed to patient regarding her appt. Jazmin Hartsell,CMA

## 2015-01-14 ENCOUNTER — Other Ambulatory Visit: Payer: Self-pay | Admitting: *Deleted

## 2015-01-14 MED ORDER — TRAMADOL HCL 50 MG PO TABS: 50.0000 mg | ORAL_TABLET | Freq: Three times a day (TID) | ORAL | Status: DC

## 2015-02-21 ENCOUNTER — Ambulatory Visit: Payer: Self-pay | Admitting: Family Medicine

## 2015-03-31 ENCOUNTER — Encounter: Payer: Self-pay | Admitting: Family Medicine

## 2015-05-11 NOTE — L&D Delivery Note (Signed)
  Delivery Note After a 3 contraction 2nd stage, t 9:33 PM a viable female was delivered via Vaginal, Spontaneous Delivery (Presentation: LOA  ).  APGAR: 7, 9; weight  Pending  After 2 minutes, the cord was clamped and cut. 40 units of pitocin diluted in 1000cc LR was infused rapidly IV.  The placenta separated spontaneously and delivered via CCT and maternal pushing effort.  It was inspected and appears to be intact with a 3 VC.   Anesthesia:  epidural Episiotomy: None Lacerations: None Suture Repair:  Est. Blood Loss (mL): 150  Mom to postpartum.  Baby to Couplet care / Skin to Skin    Delivery by Dr. Mertie ClauseE Yoo and 3rd stage by Woodward KuZ McDonald, MS-3 u nder my direct supervision. Marland Kitchen.  Jimenez,Sherry Suazo 02/05/2016, 9:44 PM

## 2015-05-29 ENCOUNTER — Encounter: Payer: Self-pay | Admitting: Family Medicine

## 2015-05-29 ENCOUNTER — Ambulatory Visit (INDEPENDENT_AMBULATORY_CARE_PROVIDER_SITE_OTHER): Payer: BC Managed Care – PPO | Admitting: Family Medicine

## 2015-05-29 VITALS — BP 128/78 | HR 83 | Temp 98.0°F | Wt 222.0 lb

## 2015-05-29 DIAGNOSIS — F418 Other specified anxiety disorders: Secondary | ICD-10-CM

## 2015-05-29 DIAGNOSIS — Z23 Encounter for immunization: Secondary | ICD-10-CM | POA: Diagnosis not present

## 2015-05-29 DIAGNOSIS — M2550 Pain in unspecified joint: Secondary | ICD-10-CM | POA: Diagnosis not present

## 2015-05-29 DIAGNOSIS — R5382 Chronic fatigue, unspecified: Secondary | ICD-10-CM | POA: Diagnosis not present

## 2015-05-29 DIAGNOSIS — G4700 Insomnia, unspecified: Secondary | ICD-10-CM

## 2015-05-29 DIAGNOSIS — M797 Fibromyalgia: Secondary | ICD-10-CM

## 2015-05-29 MED ORDER — CITALOPRAM HYDROBROMIDE 20 MG PO TABS: 20.0000 mg | ORAL_TABLET | Freq: Every day | ORAL | Status: DC

## 2015-05-29 MED ORDER — HYDROCODONE-ACETAMINOPHEN 5-325 MG PO TABS: 1.0000 | ORAL_TABLET | Freq: Four times a day (QID) | ORAL | Status: DC | PRN

## 2015-05-29 MED ORDER — PROMETHAZINE HCL 12.5 MG PO TABS: ORAL_TABLET | ORAL | Status: DC

## 2015-05-29 NOTE — Assessment & Plan Note (Signed)
Multiple complaints today widespread pain in bilateral posterior legs and feet as well as bilateral hands and neck.  Also expressing difficulties with a lot of anxiety and panic attacks since her recent miscarriage.  She thinks that a recent miscarriage triggered worsening of her symptoms.  She reports her biggest concern today is anxiety.  Also has difficulties with sleep and fatigue as well as regional pain syndromes including symptoms of IBS ( gas, bloating without diarrhea or constipation) and right ear fullness and tinnitus.  She reports Ultram is no longer working for her and causes nausea - Discontinue Ultram.  - Start Norco 5 mg 1-2 times daily as needed for pain.  Advised this would not be a long-term medication.  Will only provide for 1-2 months as we are beginning fibromyalgia medication, and titrating to therapeutic doses - Start Celexa 10 mg daily 1 week, increase to 20 mg every morning.  Advised this may cause some worsening anxiety in the beginning.  She will call in 2 weeks and we will titrate this medication as needed.  - Discussed starting Lyrica for fibromyalgia pain.  Will defer until we are able to titrate Celexa.  Previously reports weight gain with gabapentin and not want to restart this medication - Discussed amitriptyline. However, she reports he had "multiple side effects from this medication" - Recommended trying melatonin for sleep

## 2015-05-29 NOTE — Progress Notes (Signed)
   Subjective:    Patient ID: Sherry Jimenez, female    DOB: June 15, 1980, 35 y.o.   MRN: [id number]  Seen for Same day visit for   CC: Multiple complaints  She reports to 3 months of excessive fatigue and widespread pain as well as worsening anxiety and panic attacks.  She reports she is most concerned by the anxiety and inability to control racing thoughts.  Additionally, she reports abdominal bloating and gas as well as right ear fullness and ringing.  Reports GI illness several weeks ago that has resolved.  She has continued to take Ultram reports it is no longer providing relief for her and causing more nausea.   Review of Systems   See HPI for ROS. Objective:  BP 128/78 mmHg  Pulse 83  Temp(Src) 98 F (36.7 C) (Oral)  Wt 222 lb (100.699 kg)  LMP 05/11/2015 (Approximate)  General: NAD HEENT: TMs clear; no pharyngeal erythema Cardiac: RRR, normal heart sounds, no murmurs. 2+ radial and PT pulses bilaterally Respiratory: CTAB, normal effort Abdomen: soft, nontender, nondistended, no hepatic or splenomegaly. Bowel sounds present Extremities: no edema or cyanosis. WWP. Skin: warm and dry, no rashes noted Neuro: alert and oriented, no focal deficits    Assessment & Plan:   Fibromyalgia Multiple complaints today widespread pain in bilateral posterior legs and feet as well as bilateral hands and neck.  Also expressing difficulties with a lot of anxiety and panic attacks since her recent miscarriage.  She thinks that a recent miscarriage triggered worsening of her symptoms.  She reports her biggest concern today is anxiety.  Also has difficulties with sleep and fatigue as well as regional pain syndromes including symptoms of IBS ( gas, bloating without diarrhea or constipation) and right ear fullness and tinnitus.  She reports Ultram is no longer working for her and causes nausea - Discontinue Ultram.  - Start Norco 5 mg 1-2 times daily as needed for pain.  Advised this would not  be a long-term medication.  Will only provide for 1-2 months as we are beginning fibromyalgia medication, and titrating to therapeutic doses - Start Celexa 10 mg daily 1 week, increase to 20 mg every morning.  Advised this may cause some worsening anxiety in the beginning.  She will call in 2 weeks and we will titrate this medication as needed.  - Discussed starting Lyrica for fibromyalgia pain.  Will defer until we are able to titrate Celexa.  Previously reports weight gain with gabapentin and not want to restart this medication - Discussed amitriptyline. However, she reports he had "multiple side effects from this medication" - Recommended trying melatonin for sleep

## 2015-05-29 NOTE — Patient Instructions (Signed)
It was great seeing you today.  Mostly your symptoms are consistent with fibromyalgia.   1. Start Celexa 10 mg one half tab every morning for one week,  then increase to 20 mg 1 pill every morning.  2. Called to let me know how you're doing with this medication in 2 weeks.  3. He can take Norco one pill daily as needed for pain while we are slowly increasing your fibromyalgia medications.  We will not plan to continue this medication as the long run it will cause more side effects than gain.  4. You can try taking melatonin to help with sleep   Please bring all your medications to every doctors visit  Sign up for My Chart to have easy access to your labs results, and communication with your Primary care physician.  Next Appointment  Please call to make an appointment with Dr Gayla Doss in 6-8 weeks   I look forward to talking with you again at our next visit. If you have any questions or concerns before then, please call the clinic at [phone].  Take Care,   Dr Wenda Low

## 2015-06-13 ENCOUNTER — Telehealth: Payer: Self-pay | Admitting: Family Medicine

## 2015-06-13 NOTE — Telephone Encounter (Signed)
Pt called to let the doctor know that she is not the taking hydrocodone. The Celexa is okay but she is still having trouble sleeping good. She also said you can send in the Lyrica . jw

## 2015-06-16 NOTE — Telephone Encounter (Signed)
Will forward to MD. Taheerah Guldin,CMA  

## 2015-06-16 NOTE — Telephone Encounter (Signed)
Pt is calling to check the status of her request for Lyrica. jw

## 2015-06-17 ENCOUNTER — Telehealth: Payer: Self-pay | Admitting: Family Medicine

## 2015-06-17 MED ORDER — PREGABALIN 75 MG PO CAPS: 75.0000 mg | ORAL_CAPSULE | Freq: Two times a day (BID) | ORAL | Status: DC

## 2015-06-18 ENCOUNTER — Telehealth: Payer: Self-pay | Admitting: Family Medicine

## 2015-06-18 NOTE — Telephone Encounter (Signed)
Patient is aware of this and she already has an appt for 07/08/15. Jamiracle Avants,CMA

## 2015-06-23 ENCOUNTER — Ambulatory Visit: Payer: BC Managed Care – PPO | Admitting: Family Medicine

## 2015-06-23 ENCOUNTER — Ambulatory Visit (INDEPENDENT_AMBULATORY_CARE_PROVIDER_SITE_OTHER): Payer: BC Managed Care – PPO | Admitting: Family Medicine

## 2015-06-23 ENCOUNTER — Encounter: Payer: Self-pay | Admitting: Family Medicine

## 2015-06-23 VITALS — BP 121/77 | HR 89 | Temp 98.2°F | Wt 216.3 lb

## 2015-06-23 DIAGNOSIS — I10 Essential (primary) hypertension: Secondary | ICD-10-CM | POA: Diagnosis not present

## 2015-06-23 DIAGNOSIS — Z331 Pregnant state, incidental: Secondary | ICD-10-CM | POA: Diagnosis not present

## 2015-06-23 DIAGNOSIS — M797 Fibromyalgia: Secondary | ICD-10-CM

## 2015-06-23 DIAGNOSIS — J189 Pneumonia, unspecified organism: Secondary | ICD-10-CM | POA: Diagnosis not present

## 2015-06-23 DIAGNOSIS — Z3490 Encounter for supervision of normal pregnancy, unspecified, unspecified trimester: Secondary | ICD-10-CM | POA: Insufficient documentation

## 2015-06-23 DIAGNOSIS — Z3200 Encounter for pregnancy test, result unknown: Secondary | ICD-10-CM

## 2015-06-23 LAB — POCT URINE PREGNANCY: Preg Test, Ur: POSITIVE — AB

## 2015-06-23 MED ORDER — CLINDAMYCIN HCL 300 MG PO CAPS: 600.0000 mg | ORAL_CAPSULE | Freq: Three times a day (TID) | ORAL | Status: DC

## 2015-06-23 MED ORDER — PRENATAL 19 29-1 MG PO TABS: 1.0000 | ORAL_TABLET | Freq: Every day | ORAL | Status: DC

## 2015-06-23 NOTE — Assessment & Plan Note (Signed)
Positive pregnancy test today in clinic.  Due to history of hypertension and multiple miscarriages refer to obstetricians for ongoing management of pregnancy - Start prenatal vitamins - Discontinued Norvasc; her blood pressure is within normal limits today and she has been off this medication for several days.  - Advised following up with OB doctor next 1-2 weeks for blood pressure recheck - Discontinue Lyrica; she never started this medication and is pregnancy category C -

## 2015-06-23 NOTE — Assessment & Plan Note (Signed)
Likely given productive cough, shortness of breath with crackles and rhonchi on exam - Unable to take azithromycin or penicillins due to allergies - Clindamycin 600 mg 3 times a day 7 days

## 2015-06-23 NOTE — Assessment & Plan Note (Signed)
Blood pressure is well controlled today, even off Norvasc - Recommend a continued to hold Norvasc until follows up with obstetrician this for blood pressure recheck

## 2015-06-23 NOTE — Progress Notes (Signed)
  Patient name: Sherry Jimenez MRN [id number]  Date of birth: Jul 09, 1980  CC & HPI:  Sherry Jimenez is a 35 y.o. female presenting today for pregnancy testing as well as cough.   Positive pregnancy test at home - LMP 04/21/15 - History of multiple miscarriages - She stopped taking all her medications when she had a positive home pregnant test  Cough - She reports to 3 days of productive cough as well as subjective fevers and shortness of breath - Has been afraid to take anything due to positive pregnancy test - Sick contacts in both her kids with similar symptoms - No history of asthma - Denies chest pain  Smoking history noted  Objective Findings:  Vitals: BP 121/77 mmHg  Pulse 89  Temp(Src) 98.2 F (36.8 C) (Oral)  Wt 216 lb 4.8 oz (98.113 kg)  LMP 04/21/2015 (Exact Date)  Gen: NAD CV: RRR w/o m/r/g, pulses +2 b/l Resp: Mild diffuse wheezes on exam with left lower lobe rhonchi and crackles  Assessment & Plan:   Pregnancy Positive pregnancy test today in clinic.  Due to history of hypertension and multiple miscarriages refer to obstetricians for ongoing management of pregnancy - Start prenatal vitamins - Discontinued Norvasc; her blood pressure is within normal limits today and she has been off this medication for several days.  - Advised following up with OB doctor next 1-2 weeks for blood pressure recheck - Discontinue Lyrica; she never started this medication and is pregnancy category C -   CAP (community acquired pneumonia) Likely given productive cough, shortness of breath with crackles and rhonchi on exam - Unable to take azithromycin or penicillins due to allergies - Clindamycin 600 mg 3 times a day 7 days  Essential hypertension, benign Blood pressure is well controlled today, even off Norvasc - Recommend a continued to hold Norvasc until follows up with obstetrician this for blood pressure recheck

## 2015-06-23 NOTE — Patient Instructions (Addendum)
It was great seeing you today.   1. Take clindamycin 600 mg 2 pills 3 times a day for 1 week.  2. I have referred to to [facility] for treatment of her pregnancy and high blood pressure.  Call if you have not received an appointment with them in one week 3. Stop taking Norvasc for blood pressure as it is normal today.  4. Stop taking Lyrica 5. Continue taking prenatal vitamins daily.  6. You can take Tylenol as needed for headaches and pain.    Please bring all your medications to every doctors visit  Sign up for My Chart to have easy access to your labs results, and communication with your Primary care physician.  Next Appointment  Please call to make an appointment with Dr Gayla Doss if you are unable to be seen by women's clinic in 1-1.5 weeks   I look forward to talking with you again at our next visit. If you have any questions or concerns before then, please call the clinic at [phone].  Take Care,   Dr Wenda Low

## 2015-06-25 ENCOUNTER — Telehealth: Payer: Self-pay | Admitting: Family Medicine

## 2015-06-25 NOTE — Telephone Encounter (Signed)
Cant get an appt until 07-17-15.  Since she had a miscarriage before this one,  She is wanting to get an ultrasound as soon as possible.  Also should she try to come back here for OB?

## 2015-06-25 NOTE — Telephone Encounter (Signed)
Please call. Yes, she can make an appointment at [facility] in the next week to recheck blood pressure and discuss ultrasound.

## 2015-06-25 NOTE — Telephone Encounter (Signed)
App made for 06/30/15. Fleeger, Maryjo Rochester, CMA

## 2015-06-30 ENCOUNTER — Ambulatory Visit (INDEPENDENT_AMBULATORY_CARE_PROVIDER_SITE_OTHER): Payer: BC Managed Care – PPO

## 2015-06-30 ENCOUNTER — Ambulatory Visit: Payer: BC Managed Care – PPO | Admitting: Family Medicine

## 2015-06-30 VITALS — BP 118/78

## 2015-06-30 DIAGNOSIS — O3680X1 Pregnancy with inconclusive fetal viability, fetus 1: Secondary | ICD-10-CM

## 2015-06-30 DIAGNOSIS — O3680X0 Pregnancy with inconclusive fetal viability, not applicable or unspecified: Secondary | ICD-10-CM | POA: Diagnosis not present

## 2015-06-30 DIAGNOSIS — Z3689 Encounter for other specified antenatal screening: Secondary | ICD-10-CM

## 2015-06-30 DIAGNOSIS — Z36 Encounter for antenatal screening of mother: Secondary | ICD-10-CM | POA: Diagnosis not present

## 2015-06-30 NOTE — Progress Notes (Signed)
Pt here today for BP check.  Pt states that she has been referred from [facility].  Pt also states that she has not been on any BP medication for management of HTN because her PCP advised her not take it until after she has spoken to her OB provider.  Notifed Dr. Jolayne Panther of pt's BP.  Dr. Jolayne Panther recommendation is continue to monitor for sx of elevated BP; that she will continue to go without BP medication due to pressure being stable at this time.  OB US scheduled for Friday, February 24th @ 1400.  Pt notified.

## 2015-06-30 NOTE — Progress Notes (Signed)
Pt desired viability assessment due to history of miscarriage.  Bedside US performed - Single IUP, FHR - 154 per PW doppler.

## 2015-07-04 ENCOUNTER — Ambulatory Visit (HOSPITAL_COMMUNITY)
Admission: RE | Admit: 2015-07-04 | Discharge: 2015-07-04 | Disposition: A | Discharge status: Home / Self Care | Payer: BC Managed Care – PPO | Source: Ambulatory Visit | Attending: Family Medicine | Admitting: Family Medicine

## 2015-07-04 ENCOUNTER — Other Ambulatory Visit: Payer: Self-pay | Admitting: Family Medicine

## 2015-07-04 DIAGNOSIS — Z3A08 8 weeks gestation of pregnancy: Secondary | ICD-10-CM | POA: Insufficient documentation

## 2015-07-04 DIAGNOSIS — Z36 Encounter for antenatal screening of mother: Secondary | ICD-10-CM | POA: Diagnosis present

## 2015-07-04 DIAGNOSIS — Z3689 Encounter for other specified antenatal screening: Secondary | ICD-10-CM

## 2015-07-08 ENCOUNTER — Ambulatory Visit: Payer: BC Managed Care – PPO | Admitting: Family Medicine

## 2015-07-08 ENCOUNTER — Telehealth: Payer: Self-pay | Admitting: *Deleted

## 2015-07-08 NOTE — Telephone Encounter (Signed)
Pt left message stating that she is calling for test results.

## 2015-07-10 NOTE — Telephone Encounter (Signed)
Called pt and she stated that she has reviewed her [place] results via My Chart. Pt asked about the mention of a small subchorionic hemorrhage.  I advised pt that this is not of concern provided she is not having any bleeding.  Pt voiced understanding.

## 2015-07-17 ENCOUNTER — Encounter: Payer: Self-pay | Admitting: Family

## 2015-07-17 ENCOUNTER — Ambulatory Visit (INDEPENDENT_AMBULATORY_CARE_PROVIDER_SITE_OTHER): Payer: BC Managed Care – PPO | Admitting: Family

## 2015-07-17 ENCOUNTER — Other Ambulatory Visit: Payer: Self-pay | Admitting: Family

## 2015-07-17 VITALS — BP 132/70 | Temp 98.9°F | Wt 224.5 lb

## 2015-07-17 DIAGNOSIS — Z113 Encounter for screening for infections with a predominantly sexual mode of transmission: Secondary | ICD-10-CM

## 2015-07-17 DIAGNOSIS — O10911 Unspecified pre-existing hypertension complicating pregnancy, first trimester: Secondary | ICD-10-CM | POA: Diagnosis not present

## 2015-07-17 DIAGNOSIS — O09521 Supervision of elderly multigravida, first trimester: Secondary | ICD-10-CM

## 2015-07-17 DIAGNOSIS — O09529 Supervision of elderly multigravida, unspecified trimester: Secondary | ICD-10-CM | POA: Insufficient documentation

## 2015-07-17 DIAGNOSIS — O10919 Unspecified pre-existing hypertension complicating pregnancy, unspecified trimester: Secondary | ICD-10-CM | POA: Insufficient documentation

## 2015-07-17 DIAGNOSIS — O0990 Supervision of high risk pregnancy, unspecified, unspecified trimester: Secondary | ICD-10-CM | POA: Insufficient documentation

## 2015-07-17 DIAGNOSIS — O0992 Supervision of high risk pregnancy, unspecified, second trimester: Secondary | ICD-10-CM

## 2015-07-17 LAB — POCT URINALYSIS DIP (DEVICE)
BILIRUBIN URINE: NEGATIVE
Glucose, UA: NEGATIVE mg/dL
HGB URINE DIPSTICK: NEGATIVE
KETONES UR: NEGATIVE mg/dL
LEUKOCYTES UA: NEGATIVE
NITRITE: NEGATIVE
PH: 7 (ref 5.0–8.0)
Protein, ur: NEGATIVE mg/dL
Specific Gravity, Urine: 1.02 (ref 1.005–1.030)
Urobilinogen, UA: 1 mg/dL (ref 0.0–1.0)

## 2015-07-17 LAB — COMPREHENSIVE METABOLIC PANEL
ALBUMIN: 3.8 g/dL (ref 3.6–5.1)
ALT: 14 U/L (ref 6–29)
AST: 14 U/L (ref 10–30)
Alkaline Phosphatase: 52 U/L (ref 33–115)
BILIRUBIN TOTAL: 0.4 mg/dL (ref 0.2–1.2)
BUN: 5 mg/dL — ABNORMAL LOW (ref 7–25)
CALCIUM: 9.2 mg/dL (ref 8.6–10.2)
CHLORIDE: 103 mmol/L (ref 98–110)
CO2: 22 mmol/L (ref 20–31)
Creat: 0.49 mg/dL — ABNORMAL LOW (ref 0.50–1.10)
GLUCOSE: 75 mg/dL (ref 65–99)
Potassium: 3.5 mmol/L (ref 3.5–5.3)
Sodium: 135 mmol/L (ref 135–146)
Total Protein: 7 g/dL (ref 6.1–8.1)

## 2015-07-17 NOTE — Progress Notes (Signed)
Subjective:    Sherry Jimenez is a [id number] 10 w being seen today for her first obstetrical visit.  Her obstetrical history is significant for advanced maternal age and chronic hypertension.  Pt also has fibromyalgia requesting meds for pain.  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   Patient does not intend to breast feed. Pregnancy history fully reviewed.  Patient reports no bleeding, no cramping and generalized body aches related to fibromyalgia.  Filed Vitals:   07/17/15 0918  BP: 132/70  Temp: 98.9 F (37.2 C)  Weight: 224 lb 8 oz (101.833 kg)    HISTORY: OB History  Gravida Para Term Preterm AB SAB TAB Ectopic Multiple Living  # Outcome Date GA Lbr Len/2nd Weight Sex Delivery Anes PTL Lv  5 Current           4 SAB     U    FD  3 Gravida    7 lb (3.175 kg) F  EPI N Y  2 Gravida    8 lb (3.629 kg) F Vag-Vacuum EPI Y Y  1 Term    8 lb 15 oz (4.054 kg) F  EPI N Y     Past Medical History  Diagnosis Date  . Anemia   . Gestational diabetes   . UTI (lower urinary tract infection)   . BV (bacterial vaginosis)   . Cholelithiasis   . Vaginal yeast infection   . Hypertension   . Fibromyalgia    Past Surgical History  Procedure Laterality Date  . Cholecystectomy    . Tonsilectomy, adenoidectomy, bilateral myringotomy and tubes     Family History  Problem Relation Age of Onset  . Diabetes Father   . Alcohol abuse Father      Exam   Filed Vitals:   07/17/15 0918  BP: 132/70  Temp: 98.9 F (37.2 C)   System: Breast:  No nipple retraction or dimpling, No nipple discharge or bleeding, No axillary or supraclavicular adenopathy, Normal to palpation without dominant masses   Skin: normal coloration and turgor, no rashes    Neurologic: negative   Extremities: normal strength, tone, and muscle mass   HEENT neck supple with midline trachea and thyroid without masses   Mouth/Teeth mucous membranes moist, pharynx  normal without lesions   Neck supple and no masses   Cardiovascular: regular rate and rhythm, no murmurs or gallops   Respiratory:  appears well, vitals normal, no respiratory distress, acyanotic, normal RR, neck free of mass or lymphadenopathy, chest clear, no wheezing, crepitations, rhonchi, normal symmetric air entry   Abdomen: soft, non-tender; bowel sounds normal; no masses,  no organomegaly   Assessment:    Pregnancy: A5W0981 Patient Active Problem List   Diagnosis Date Noted  . Supervision of high risk pregnancy, antepartum 07/17/2015    Priority: High  . Chronic hypertension during pregnancy, antepartum 07/17/2015    Priority: High  . Advanced maternal age in multigravida 07/17/2015    Priority: Medium  . Pregnancy 06/23/2015  . CAP (community acquired pneumonia) 06/23/2015  . 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 Fibromyalgia 05/29/2015  . Encounter for fertility planning 07/31/2014  . Sinus congestion 07/08/2014  . Arthritic-like pain 08/14/2013  . Hypovitaminosis D 11/27/2012  . Allergic conjunctivitis 10/17/2012  . Dysfunctional uterine bleeding 09/20/2012  . Superficial acne vulgaris 07/05/2012  . Low back pain 07/05/2012  . Essential hypertension, benign 03/23/2012  .  Lymphadenopathy of left cervical region 03/02/2012  . Anemia 10/29/2010  . Insomnia 10/19/2010  . Fatigue 10/19/2010  . Irregular menses 10/19/2010  . Obesity 10/19/2010        Plan:     Initial labs drawn.  Begin baby ASA after 14 weeks Discuss medication treatment regarding fibromyalgia with Dr. Gayla DossJoyner Prenatal vitamins. Problem list reviewed and updated. Genetic Screening discussed First Screen: ordered.  Follow up in 3 weeks.   Marlis EdelsonKARIM, WALIDAH N 07/17/2015

## 2015-07-17 NOTE — Patient Instructions (Signed)

## 2015-07-17 NOTE — Progress Notes (Signed)
First trimester screen scheduled for 08/07/2015 @10 :00AM

## 2015-07-18 LAB — PRENATAL PROFILE (SOLSTAS)
ANTIBODY SCREEN: NEGATIVE
BASOS PCT: 0 % (ref 0–1)
Basophils Absolute: 0 10*3/uL (ref 0.0–0.1)
EOS ABS: 0.2 10*3/uL (ref 0.0–0.7)
EOS PCT: 3 % (ref 0–5)
HCT: 32.9 % — ABNORMAL LOW (ref 36.0–46.0)
HEMOGLOBIN: 10.6 g/dL — AB (ref 12.0–15.0)
HIV 1&2 Ab, 4th Generation: NONREACTIVE
Hepatitis B Surface Ag: NEGATIVE
LYMPHS ABS: 2 10*3/uL (ref 0.7–4.0)
Lymphocytes Relative: 34 % (ref 12–46)
MCH: 25.7 pg — ABNORMAL LOW (ref 26.0–34.0)
MCHC: 32.2 g/dL (ref 30.0–36.0)
MCV: 79.7 fL (ref 78.0–100.0)
MONOS PCT: 9 % (ref 3–12)
MPV: 11 fL (ref 8.6–12.4)
Monocytes Absolute: 0.5 10*3/uL (ref 0.1–1.0)
NEUTROS PCT: 54 % (ref 43–77)
Neutro Abs: 3.2 10*3/uL (ref 1.7–7.7)
PLATELETS: 264 10*3/uL (ref 150–400)
RBC: 4.13 MIL/uL (ref 3.87–5.11)
RDW: 16.1 % — ABNORMAL HIGH (ref 11.5–15.5)
RH TYPE: POSITIVE
RUBELLA: 4.78 {index} — AB (ref <0.90)
WBC: 6 10*3/uL (ref 4.0–10.5)

## 2015-07-18 LAB — CULTURE, OB URINE
COLONY COUNT: NO GROWTH
ORGANISM ID, BACTERIA: NO GROWTH

## 2015-07-18 LAB — PROTEIN / CREATININE RATIO, URINE
Creatinine, Urine: 142 mg/dL (ref 20–320)
PROTEIN CREATININE RATIO: 77 mg/g{creat} (ref 21–161)
Total Protein, Urine: 11 mg/dL (ref 5–24)

## 2015-07-18 LAB — GC/CHLAMYDIA PROBE AMP (~~LOC~~) NOT AT ARMC
CHLAMYDIA, DNA PROBE: NEGATIVE
NEISSERIA GONORRHEA: NEGATIVE

## 2015-07-21 LAB — HEMOGLOBINOPATHY EVALUATION
HEMOGLOBIN OTHER: 0 %
HGB F QUANT: 0 % (ref 0.0–2.0)
HGB S QUANTITAION: 0 %
Hgb A2 Quant: 2.5 % (ref 2.2–3.2)
Hgb A: 97.5 % (ref 96.8–97.8)

## 2015-07-22 LAB — GLUCOSE TOLERANCE, 1 HOUR (50G) W/O FASTING: Glucose, 1 Hr, gestational: 86 mg/dL

## 2015-07-22 LAB — PRESCRIPTION MONITORING PROFILE (19 PANEL)
Amphetamine/Meth: NEGATIVE ng/mL
BUPRENORPHINE, URINE: NEGATIVE ng/mL
Barbiturate Screen, Urine: NEGATIVE ng/mL
Benzodiazepine Screen, Urine: NEGATIVE ng/mL
Carisoprodol, Urine: NEGATIVE ng/mL
Cocaine Metabolites: NEGATIVE ng/mL
Creatinine, Urine: 138.51 mg/dL (ref >20.0)
ECSTASY: NEGATIVE ng/mL
Fentanyl, Ur: NEGATIVE ng/mL
MEPERIDINE UR: NEGATIVE ng/mL
METHADONE SCREEN, URINE: NEGATIVE ng/mL
METHAQUALONE SCREEN (URINE): NEGATIVE ng/mL
NITRITES URINE, INITIAL: NEGATIVE ug/mL
Opiate Screen, Urine: NEGATIVE ng/mL
Oxycodone Screen, Ur: NEGATIVE ng/mL
PROPOXYPHENE: NEGATIVE ng/mL
Phencyclidine, Ur: NEGATIVE ng/mL
TRAMADOL UR: NEGATIVE ng/mL
Tapentadol, urine: NEGATIVE ng/mL
Zolpidem, Urine: NEGATIVE ng/mL
pH, Initial: 7 pH (ref 4.5–8.9)

## 2015-07-22 LAB — CANNABANOIDS (GC/LC/MS), URINE: THC-COOH UR CONFIRM: 61 ng/mL — AB (ref <5)

## 2015-07-24 ENCOUNTER — Encounter (HOSPITAL_COMMUNITY): Payer: Self-pay | Admitting: Family

## 2015-07-24 ENCOUNTER — Ambulatory Visit (HOSPITAL_COMMUNITY): Payer: BC Managed Care – PPO

## 2015-08-05 ENCOUNTER — Ambulatory Visit (HOSPITAL_COMMUNITY): Payer: BC Managed Care – PPO

## 2015-08-07 ENCOUNTER — Ambulatory Visit (HOSPITAL_COMMUNITY)
Admission: RE | Admit: 2015-08-07 | Discharge: 2015-08-07 | Disposition: A | Discharge status: Home / Self Care | Payer: BC Managed Care – PPO | Source: Ambulatory Visit | Attending: Family | Admitting: Family

## 2015-08-07 ENCOUNTER — Other Ambulatory Visit: Payer: Self-pay | Admitting: Family

## 2015-08-07 ENCOUNTER — Ambulatory Visit (HOSPITAL_COMMUNITY): Payer: BC Managed Care – PPO

## 2015-08-07 ENCOUNTER — Encounter (HOSPITAL_COMMUNITY): Payer: Self-pay

## 2015-08-07 ENCOUNTER — Ambulatory Visit (INDEPENDENT_AMBULATORY_CARE_PROVIDER_SITE_OTHER): Payer: BC Managed Care – PPO | Admitting: Obstetrics and Gynecology

## 2015-08-07 VITALS — BP 116/74 | HR 75 | Wt 230.2 lb

## 2015-08-07 VITALS — BP 127/82 | HR 86 | Temp 98.4°F | Wt 229.2 lb

## 2015-08-07 DIAGNOSIS — O99321 Drug use complicating pregnancy, first trimester: Secondary | ICD-10-CM | POA: Diagnosis not present

## 2015-08-07 DIAGNOSIS — Z315 Encounter for genetic counseling: Secondary | ICD-10-CM | POA: Diagnosis not present

## 2015-08-07 DIAGNOSIS — O10011 Pre-existing essential hypertension complicating pregnancy, first trimester: Secondary | ICD-10-CM | POA: Insufficient documentation

## 2015-08-07 DIAGNOSIS — O09529 Supervision of elderly multigravida, unspecified trimester: Secondary | ICD-10-CM

## 2015-08-07 DIAGNOSIS — E669 Obesity, unspecified: Secondary | ICD-10-CM

## 2015-08-07 DIAGNOSIS — Z3A13 13 weeks gestation of pregnancy: Secondary | ICD-10-CM | POA: Diagnosis not present

## 2015-08-07 DIAGNOSIS — O09291 Supervision of pregnancy with other poor reproductive or obstetric history, first trimester: Secondary | ICD-10-CM | POA: Insufficient documentation

## 2015-08-07 DIAGNOSIS — O99211 Obesity complicating pregnancy, first trimester: Secondary | ICD-10-CM

## 2015-08-07 DIAGNOSIS — Z369 Encounter for antenatal screening, unspecified: Secondary | ICD-10-CM

## 2015-08-07 DIAGNOSIS — O09521 Supervision of elderly multigravida, first trimester: Secondary | ICD-10-CM

## 2015-08-07 DIAGNOSIS — I10 Essential (primary) hypertension: Secondary | ICD-10-CM

## 2015-08-07 DIAGNOSIS — O10019 Pre-existing essential hypertension complicating pregnancy, unspecified trimester: Secondary | ICD-10-CM

## 2015-08-07 DIAGNOSIS — F418 Other specified anxiety disorders: Secondary | ICD-10-CM

## 2015-08-07 DIAGNOSIS — O0990 Supervision of high risk pregnancy, unspecified, unspecified trimester: Secondary | ICD-10-CM

## 2015-08-07 DIAGNOSIS — O99011 Anemia complicating pregnancy, first trimester: Secondary | ICD-10-CM

## 2015-08-07 DIAGNOSIS — Z8632 Personal history of gestational diabetes: Secondary | ICD-10-CM

## 2015-08-07 DIAGNOSIS — O0992 Supervision of high risk pregnancy, unspecified, second trimester: Secondary | ICD-10-CM

## 2015-08-07 DIAGNOSIS — O99341 Other mental disorders complicating pregnancy, first trimester: Secondary | ICD-10-CM

## 2015-08-07 DIAGNOSIS — D649 Anemia, unspecified: Secondary | ICD-10-CM

## 2015-08-07 LAB — POCT URINALYSIS DIP (DEVICE)
Bilirubin Urine: NEGATIVE
Glucose, UA: NEGATIVE mg/dL
HGB URINE DIPSTICK: NEGATIVE
Ketones, ur: NEGATIVE mg/dL
Leukocytes, UA: NEGATIVE
NITRITE: NEGATIVE
PH: 6 (ref 5.0–8.0)
PROTEIN: NEGATIVE mg/dL
Specific Gravity, Urine: 1.025 (ref 1.005–1.030)
UROBILINOGEN UA: 0.2 mg/dL (ref 0.0–1.0)

## 2015-08-07 MED ORDER — ASPIRIN EC 81 MG PO TBEC: 81.0000 mg | DELAYED_RELEASE_TABLET | Freq: Every day | ORAL | Status: DC

## 2015-08-07 NOTE — Progress Notes (Signed)
Genetic Counseling  High-Risk Gestation Note  Appointment Date:  08/07/2015 Referred By: Sherry Jimenez, Sherry Bedford, MD Date of Birth:  07/29/1980   Pregnancy History: [id number] Estimated Date of Delivery: 02/12/16 Estimated Gestational Age: 4961w0d Attending: Particia NearingMartha Decker, MD   [name] was seen for genetic counseling because of a maternal age of 35 y.o..     In Summary:   Reviewed maternal age-related risk for fetal aneuploidy  Patient pursued NIPS (Panorama) and NT ultrasound today; complete ultrasound results reported separately  Declined amniocentesis  Detailed ultrasound scheduled for 09/11/15  Family history significant for Hirschsprung disease in father of the pregnancy and patient's previous daughter with Asperger Disorder  She was counseled regarding maternal age and the association with risk for chromosome conditions due to nondisjunction with aging of the ova.  We reviewed chromosomes, nondisjunction, and the associated 1 in 2287 risk for fetal aneuploidy related to a maternal age of 35 y.o. at 7661w0d gestation.  She was counseled that the risk for aneuploidy decreases as gestational age increases, accounting for those pregnancies which spontaneously abort.  We specifically discussed Down syndrome (trisomy 2421), trisomies 3113 and 3018, and sex chromosome aneuploidies (47,XXX and 47,XXY) including the common features and prognoses of each.   We reviewed available screening options including First Screen, Quad screen, noninvasive prenatal screening (NIPS)/cell free DNA (cfDNA) testing, and detailed ultrasound.  She was counseled that screening tests are used to modify a patient's a priori risk for aneuploidy, typically based on age. This estimate provides a pregnancy specific risk assessment. We reviewed the benefits and limitations of each option. Specifically, we discussed the conditions for which each test screens, the detection rates, and false positive rates of each. She  was also counseled regarding diagnostic testing via [facility] and amniocentesis. We reviewed the approximate 1 in 300-500 risk for complications for amniocentesis, including spontaneous pregnancy loss. After consideration of all the options, she elected to proceed with [facility]IPS [facility]  Those results will be available in 8-10 days.    A nuchal translucency ultrasound was performed today.  The report will be documented separately.  Detailed ultrasound in the second trimester is scheduled for 09/11/15. She understands that screening tests cannot rule out all birth defects or genetic syndromes. The patient was advised of this limitation and states she still does not want additional testing at this time.   [name] was provided with written information regarding sickle cell anemia (SCA) including the carrier frequency and incidence in the African-American population, the availability of carrier testing and prenatal diagnosis if indicated.  In addition, we discussed that hemoglobinopathies are routinely screened for as part of the [place] newborn screening panel.  She previously had hemoglobin electrophoresis, which was within normal range.  Both family histories were reviewed and found to be contributory for Hirschsprung disease for the father of the pregnancy, which was treated with surgery in childhood.  Hirschsprung disease (congenital intestinal aganglionosis) is found in approximately 1 in 5,000 births.   Hirschsprung disease is typically isolated and polygenic inheritance is suspected in these cases.  However, it may be part of a multisystem disorder, one feature of an underlying genetic syndrome or result from a chromosome abnormality. Nonsyndromic Hirschsprung disease has been associated with mutations in at least 10 different genes.  Genetic testing is available for some of these on a clinical basis. Testing would further refine the recurrence risk to future pregnancies.     In individuals with nonsyndromic Hirschsprung disease  due to an unknown etiology, it is considered to be a multigene disorder with incomplete penetrance and variable expressivity. There is a 4:1 predominance in males in the case of short-segment disease, and the incidence of long-segment disease is approximately equal in males and females.  In the case of unknown etiology, the length of the aganglionic segment helps determine the recurrence.  Approximately 80% of individuals have "short-segment disease."  Data are relatively limited regarding recurrence risk for offspring of an affected individual. In the case of an autosomal dominant cause, recurrence risk may be up to 50%, though reduced penetrance is observed. In the case of a nonsyndromic form with unknown etiology, recurrence risk would be above the general population risk. The overall recurrence risk depends on the cause, which cannot always be determined. It would be important for the couple's pediatrician to be aware of this history, so that their child can be followed appropriately.   The patient reported that her oldest daughter, from a previous partner, has Asperger disorder. She is currently 36 years old and is being evaluated by various specialists. No underlying etiology has been determined. We discussed that Asperger disorder is part of the spectrum of conditions referred to as Autistic spectrum disorders (ASD). We discussed that ASDs are among the most common neurodevelopmental disorders, with approximately 1 in 68 children meeting criteria for ASD, according to the Centers for Disease Control. Approximately 80% of individuals diagnosed are female. There is strong evidence that genetic factors play a critical role in development of ASD. There have been recent advances in identifying specific genetic causes of ASD, however, there are still many individuals for whom the etiology of the ASD is not known. Once a family has a child with a diagnosis  of ASD, there is a 13.5% chance to have another child with ASD. If the pregnancy is female the chance is approximately 9%, and approximately 26% if the pregnancy is female. Recurrence risk for half-siblings, as is the case for the current pregnancy, would be lower than 13.5% estimate but above the general population risk. They understand that at this time there is not genetic testing available for ASD for most families. In the case of an identified genetic cause, recurrence risk estimate may change. Without further information regarding the provided family history, an accurate genetic risk cannot be calculated. Further genetic counseling is warranted if more information is obtained.  [name] denied exposure to environmental toxins or chemical agents. She denied the use of alcohol, tobacco or street drugs. She denied significant viral illnesses during the course of her pregnancy. Her medical and surgical histories were contributory for hypertension and fibromyalgia.   I counseled [name] regarding the above risks and available options.  The approximate face-to-face time with the genetic counselor was 40 minutes.  Quinn Plowman, MS,  Certified Genetic Counselor 08/07/2015

## 2015-08-07 NOTE — Progress Notes (Signed)
Subjective:  Sherry Jimenez is a 35 y.o. G5P1013 at 3659w0d being seen today for ongoing prenatal care.  She is currently monitored for the following issues for this high-risk pregnancy and has Insomnia; Fatigue; Irregular menses; Obesity; Anemia; Lymphadenopathy of left cervical region; Essential hypertension, benign; Superficial acne vulgaris; Low back pain; Dysfunctional uterine bleeding; Allergic conjunctivitis; Hypovitaminosis D; Arthritic-like pain; Sinus congestion; Encounter for fertility planning; Mixed anxiety and depressive disorder; Fibromyalgia; Pregnancy; CAP (community acquired pneumonia); Supervision of high risk pregnancy, antepartum; Chronic hypertension during pregnancy, antepartum; and Advanced maternal age in multigravida on her problem list.  Patient reports no complaints.  Contractions: Not present. Vag. Bleeding: None.  Movement: Absent. Denies leaking of fluid.   The following portions of the patient's history were reviewed and updated as appropriate: allergies, current medications, past family history, past medical history, past social history, past surgical history and problem list. Problem list updated.  Objective:   Filed Vitals:   08/07/15 1048  BP: 127/82  Pulse: 86  Temp: 98.4 F (36.9 C)  Weight: 229 lb 3.2 oz (103.964 kg)    Fetal Status: Fetal Heart Rate (bpm): u/s   Movement: Absent     General:  Alert, oriented and cooperative. Patient is in no acute distress.  Skin: Skin is warm and dry. No rash noted.   Cardiovascular: Normal heart rate noted  Respiratory: Normal respiratory effort, no problems with respiration noted  Abdomen: Soft, gravid, appropriate for gestational age. Pain/Pressure: Absent     Pelvic: Vag. Bleeding: None     Cervical exam deferred        Extremities: Normal range of motion.  Edema: None  Mental Status: Normal mood and affect. Normal behavior. Normal judgment and thought content.   Urinalysis: Urine Protein: Negative  Urine Glucose: Negative  Assessment and Plan:  Pregnancy: G5P1013 at 3659w0d  # chtn - bp appropriate not on meds - start aspirin ppx  - 24 hr urine ordered  # Pregnancy - first screen today - anatomy scan ordered per patient  Preterm labor symptoms and general obstetric precautions including but not limited to vaginal bleeding, contractions, leaking of fluid and fetal movement were reviewed in detail with the patient. Please refer to After Visit Summary for other counseling recommendations.    Kathrynn RunningNoah Bedford Jonette Wassel, MD

## 2015-08-07 NOTE — Progress Notes (Signed)
Pt had ultrasound today. Pt having some groin pain.

## 2015-08-14 ENCOUNTER — Telehealth (HOSPITAL_COMMUNITY): Payer: Self-pay | Admitting: MS"

## 2015-08-14 ENCOUNTER — Encounter: Payer: Self-pay | Admitting: Family Medicine

## 2015-08-14 NOTE — Telephone Encounter (Signed)
Attempted to contact patient regarding prenatal cell free DNA testing results, which are within normal range. Left message for patient to return call.   Sherry BraunKaren Andrik Sandt 08/14/2015 4:38 PM

## 2015-08-14 NOTE — Telephone Encounter (Signed)
Called Sherry Jimenez to discuss her prenatal cell free DNA test results.  Sherry Jim[facility].  Testing was offered because of maternal age.   The patient was identified by name and DOB.  We reviewed that these are within normal limits, showing a less than 1 in 10,000 risk for trisomies 21, 18 and 13, and monosomy X (Turner syndrome).  In addition, the risk for triploidy/vanishing twin and sex chromosome trisomies (47,XXX and 47,XXY) was also low risk. We reviewed that this testing identifies > 99% of pregnancies with trisomy 2021, trisomy 3813, sex chromosome trisomies (47,XXX and 47,XXY), and triploidy. The detection rate for trisomy 18 is 96%.  The detection rate for monosomy X is ~92%.  The false positive rate is <0.1% for all conditions. Testing was also consistent with female fetal sex.  The patient did wish to know fetal sex.  She understands that this testing does not identify all genetic conditions.  All questions were answered to her satisfaction, she was encouraged to call with additional questions or concerns.  Quinn PlowmanKaren Cruzita Lipa, MS Certified Genetic Counselor 08/14/2015 4:57 PM

## 2015-08-15 ENCOUNTER — Other Ambulatory Visit (HOSPITAL_COMMUNITY): Payer: Self-pay

## 2015-08-29 ENCOUNTER — Encounter (HOSPITAL_COMMUNITY): Payer: Self-pay

## 2015-08-29 ENCOUNTER — Inpatient Hospital Stay (HOSPITAL_COMMUNITY)
Admission: AD | Admit: 2015-08-29 | Discharge: 2015-08-29 | Disposition: A | Discharge status: Home / Self Care | Payer: BC Managed Care – PPO | Source: Ambulatory Visit | Attending: Obstetrics & Gynecology | Admitting: Obstetrics & Gynecology

## 2015-08-29 DIAGNOSIS — M549 Dorsalgia, unspecified: Secondary | ICD-10-CM | POA: Diagnosis not present

## 2015-08-29 DIAGNOSIS — Z3A17 17 weeks gestation of pregnancy: Secondary | ICD-10-CM | POA: Diagnosis not present

## 2015-08-29 DIAGNOSIS — F17290 Nicotine dependence, other tobacco product, uncomplicated: Secondary | ICD-10-CM | POA: Insufficient documentation

## 2015-08-29 DIAGNOSIS — O99332 Smoking (tobacco) complicating pregnancy, second trimester: Secondary | ICD-10-CM | POA: Diagnosis not present

## 2015-08-29 DIAGNOSIS — O162 Unspecified maternal hypertension, second trimester: Secondary | ICD-10-CM | POA: Diagnosis not present

## 2015-08-29 DIAGNOSIS — M797 Fibromyalgia: Secondary | ICD-10-CM | POA: Insufficient documentation

## 2015-08-29 DIAGNOSIS — O26892 Other specified pregnancy related conditions, second trimester: Secondary | ICD-10-CM | POA: Insufficient documentation

## 2015-08-29 DIAGNOSIS — Z3A16 16 weeks gestation of pregnancy: Secondary | ICD-10-CM | POA: Insufficient documentation

## 2015-08-29 DIAGNOSIS — O9989 Other specified diseases and conditions complicating pregnancy, childbirth and the puerperium: Secondary | ICD-10-CM | POA: Diagnosis not present

## 2015-08-29 LAB — URINALYSIS, ROUTINE W REFLEX MICROSCOPIC
Bilirubin Urine: NEGATIVE
GLUCOSE, UA: NEGATIVE mg/dL
Hgb urine dipstick: NEGATIVE
KETONES UR: NEGATIVE mg/dL
LEUKOCYTES UA: NEGATIVE
NITRITE: NEGATIVE
PROTEIN: NEGATIVE mg/dL
Specific Gravity, Urine: 1.025 (ref 1.005–1.030)
pH: 6 (ref 5.0–8.0)

## 2015-08-29 MED ORDER — TRAMADOL HCL 50 MG PO TABS: 50.0000 mg | ORAL_TABLET | Freq: Three times a day (TID) | ORAL | Status: DC | PRN

## 2015-08-29 MED ORDER — PROMETHAZINE HCL 25 MG PO TABS: 25.0000 mg | ORAL_TABLET | Freq: Four times a day (QID) | ORAL | Status: DC | PRN

## 2015-08-29 NOTE — MAU Provider Note (Signed)
History     CSN: [id number]  Arrival date and time: 08/29/15 1232   First Provider Initiated Contact with Patient 08/29/15 1309      Chief Complaint  Patient presents with  . Back Pain  . Leg Pain   HPI  Sherry Jimenez 35 y.o. [id number] [redacted]w[redacted]d presents to the MAU with the complaint of pain in upper thighs and some back pain. She has a significant history of fibromyalgia and had been taking a multiple drug reimen until she got pregnant. Since that time she has not been taking anything but just recently started having fibromyalgia symptoms again. She does have an appointment on May 4 to discuss a treatment regimen during pregnancy   Past Medical History  Diagnosis Date  . Anemia   . Gestational diabetes   . UTI (lower urinary tract infection)   . BV (bacterial vaginosis)   . Cholelithiasis   . Vaginal yeast infection   . Hypertension   . Fibromyalgia     Past Surgical History  Procedure Laterality Date  . Cholecystectomy    . Tonsilectomy, adenoidectomy, bilateral myringotomy and tubes      Family History  Problem Relation Age of Onset  . Diabetes Father   . Alcohol abuse Father     Social History  Substance Use Topics  . Smoking status: Current Some Day Smoker -- 0.25 packs/day for 12 years  . Smokeless tobacco: None     Comment: electronic cigarette  . Alcohol Use: No     Comment: social    Allergies:  Allergies  Allergen Reactions  . Azithromycin Other (See Comments)    Vomiting   . Penicillins Nausea And Vomiting    Has patient had a PCN reaction causing immediate rash, facial/tongue/throat swelling, SOB or lightheadedness with hypotension: No Has patient had a PCN reaction causing severe rash involving mucus membranes or skin necrosis: No Has patient had a PCN reaction that required hospitalization No Has patient had a PCN reaction occurring within the last 10 years: No If all of the above answers are "NO", then may proceed with Cephalosporin  use.    Prescriptions prior to admission  Medication Sig Dispense Refill Last Dose  . acetaminophen (TYLENOL) 500 MG tablet Take 1,000 mg by mouth every 6 (six) hours as needed for mild pain.   08/29/2015 at Unknown time  . aspirin EC 81 MG tablet Take 1 tablet (81 mg total) by mouth daily. Take after 12 weeks for prevention of preeclampsia later in pregnancy 300 tablet 2 08/28/2015 at Unknown time  . Prenatal Vit-DSS-Fe Fum-FA (PRENATAL 19) 29-1 MG TABS Take 1 tablet by mouth daily. 90 tablet 3 08/28/2015 at Unknown time  . cetirizine (ZYRTEC) 10 MG tablet Take 1 tablet (10 mg total) by mouth at bedtime. (Patient not taking: Reported on 08/29/2015) 30 tablet 11 Taking  . fluticasone (FLONASE) 50 MCG/ACT nasal spray Place 2 sprays into both nostrils daily. (Patient not taking: Reported on 07/17/2015) 16 g 6 Not Taking    Review of Systems  Musculoskeletal: Positive for myalgias and back pain.       Pain in hands; pain in posterior upper thighs  All other systems reviewed and are negative.  Physical Exam   Blood pressure 137/83, pulse 93, temperature 98.6 F (37 C), temperature source Oral, resp. rate 18, height  (1.575 m), weight 237 lb 6.4 oz (107.684 kg), last menstrual period 05/08/2015.  Physical Exam  Nursing note and vitals reviewed. Constitutional: She is oriented  to person, place, and time. She appears well-developed and well-nourished.  HENT:  Head: Normocephalic and atraumatic.  Cardiovascular: Normal rate.   Respiratory: Effort normal and breath sounds normal. No respiratory distress.  GI: Soft. There is no tenderness.  Musculoskeletal: Normal range of motion.  Neurological: She is alert and oriented to person, place, and time.  Skin: Skin is warm and dry.  Psychiatric: She has a normal mood and affect. Her behavior is normal. Judgment and thought content normal.    MAU Course  Procedures  MDM Will treat with Tramadol short term until medication regimen is decided  [facility] Dr Gayla DossJoyner  Assessment and Plan  Fibromyalgia in Pregnancy  Phenergan 25 mg po #30 Tramadol 50 mg # 45 no RF  Discharge  Clemmons,Lori Grissett 08/29/2015, 2:26 PM

## 2015-08-29 NOTE — MAU Note (Signed)
Complain of back pain for some time now with pain down back of legs.

## 2015-08-29 NOTE — Discharge Instructions (Signed)
Myofascial Pain Syndrome and Fibromyalgia  Myofascial pain syndrome and fibromyalgia are both pain disorders. This pain may be felt mainly in your muscles.   · Myofascial pain syndrome:    Always has trigger points or tender points in the muscle that will cause pain when pressed. The pain may come and go.    Usually affects your neck, upper back, and shoulder areas. The pain often radiates into your arms and hands.  · Fibromyalgia:    Has muscle pains and tenderness that come and go.    Is often associated with fatigue and sleep disturbances.    Has trigger points.    Tends to be long-lasting (chronic), but is not life-threatening.  Fibromyalgia and myofascial pain are not the same. However, they often occur together. If you have both conditions, each can make the other worse. Both are common and can cause enough pain and fatigue to make day-to-day activities difficult.   CAUSES   The exact causes of fibromyalgia and myofascial pain are not known. People with certain gene types may be more likely to develop fibromyalgia. Some factors can be triggers for both conditions, such as:   · Spine disorders.  · Arthritis.  · Severe injury (trauma) and other physical stressors.  · Being under a lot of stress.  · A medical illness.  SIGNS AND SYMPTOMS   Fibromyalgia  The main symptom of fibromyalgia is widespread pain and tenderness in your muscles. This can vary over time. Pain is sometimes described as stabbing, shooting, or burning. You may have tingling or numbness, too. You may also have sleep problems and fatigue. You may wake up feeling tired and groggy (fibro fog). Other symptoms may include:   · Bowel and bladder problems.  · Headaches.  · Visual problems.  · Problems with odors and noises.  · Depression or mood changes.  · Painful menstrual periods (dysmenorrhea).  · Dry skin or eyes.  Myofascial pain syndrome  Symptoms of myofascial pain syndrome include:   · Tight, ropy bands of muscle.    · Uncomfortable  sensations in muscular areas, such as:    Aching.    Cramping.    Burning.    Numbness.    Tingling.      Muscle weakness.  · Trouble moving certain muscles freely (range of motion).  DIAGNOSIS   There are no specific tests to diagnose fibromyalgia or myofascial pain syndrome. Both can be hard to diagnose because their symptoms are common in many other conditions. Your health care provider may suspect one or both of these conditions based on your symptoms and medical history. Your health care provider will also do a physical exam.   The key to diagnosing fibromyalgia is having pain, fatigue, and other symptoms for more than three months that cannot be explained by another condition.   The key to diagnosing myofascial pain syndrome is finding trigger points in muscles that are tender and cause pain elsewhere in your body (referred pain).  TREATMENT   Treating fibromyalgia and myofascial pain often requires a team of health care providers. This usually starts with your primary provider and a physical therapist. You may also find it helpful to work with alternative health care providers, such as massage therapists or acupuncturists.  Treatment for fibromyalgia may include medicines. This may include nonsteroidal anti-inflammatory drugs (NSAIDs), along with other medicines.   Treatment for myofascial pain may also include:  · NSAIDs.  · Cooling and stretching of muscles.  · Trigger point injections.  ·   Sound wave (ultrasound) treatments to stimulate muscles.  HOME CARE INSTRUCTIONS   · Take medicines only as directed by your health care provider.  · Exercise as directed by your health care provider or physical therapist.  · Try to avoid stressful situations.  · Practice relaxation techniques to control your stress. You may want to try:    Biofeedback.    Visual imagery.    Hypnosis.    Muscle relaxation.    Yoga.    Meditation.  · Talk to your health care provider about alternative treatments, such as acupuncture or  massage treatment.  · Maintain a healthy lifestyle. This includes eating a healthy diet and getting enough sleep.  · Consider joining a support group.  · Do not do activities that stress or strain your muscles. That includes repetitive motions and heavy lifting.  SEEK MEDICAL CARE IF:   · You have new symptoms.  · Your symptoms get worse.  · You have side effects from your medicines.  · You have trouble sleeping.  · Your condition is causing depression or anxiety.  FOR MORE INFORMATION   · National Fibromyalgia Association: http://www.fmaware.orgwww.fmaware.org  · Arthritis Foundation: http://www.arthritis.orgwww.arthritis.org  · American Chronic Pain Association: http://www.theacpa.org/condition/myofascial-painwww.theacpa.org/condition/myofascial-pain     This information is not intended to replace advice given to you by your health care provider. Make sure you discuss any questions you have with your health care provider.     Document Released: 04/26/2005 Document Revised: 05/17/2014 Document Reviewed: 01/30/2014  Elsevier Interactive Patient Education ©2016 Elsevier Inc.

## 2015-09-04 ENCOUNTER — Encounter: Payer: BC Managed Care – PPO | Admitting: Family Medicine

## 2015-09-11 ENCOUNTER — Ambulatory Visit (INDEPENDENT_AMBULATORY_CARE_PROVIDER_SITE_OTHER): Payer: BC Managed Care – PPO | Admitting: Obstetrics & Gynecology

## 2015-09-11 ENCOUNTER — Ambulatory Visit (HOSPITAL_COMMUNITY)
Admission: RE | Admit: 2015-09-11 | Discharge: 2015-09-11 | Disposition: A | Discharge status: Home / Self Care | Payer: BC Managed Care – PPO | Source: Ambulatory Visit | Attending: Family | Admitting: Family

## 2015-09-11 ENCOUNTER — Other Ambulatory Visit (HOSPITAL_COMMUNITY): Payer: Self-pay | Admitting: Maternal and Fetal Medicine

## 2015-09-11 ENCOUNTER — Encounter (HOSPITAL_COMMUNITY): Payer: Self-pay

## 2015-09-11 VITALS — BP 118/80 | HR 78 | Wt 236.5 lb

## 2015-09-11 VITALS — BP 119/79 | HR 87 | Wt 236.7 lb

## 2015-09-11 DIAGNOSIS — O99322 Drug use complicating pregnancy, second trimester: Secondary | ICD-10-CM | POA: Diagnosis not present

## 2015-09-11 DIAGNOSIS — O99212 Obesity complicating pregnancy, second trimester: Secondary | ICD-10-CM

## 2015-09-11 DIAGNOSIS — O09522 Supervision of elderly multigravida, second trimester: Secondary | ICD-10-CM | POA: Diagnosis not present

## 2015-09-11 DIAGNOSIS — O10912 Unspecified pre-existing hypertension complicating pregnancy, second trimester: Secondary | ICD-10-CM

## 2015-09-11 DIAGNOSIS — O09292 Supervision of pregnancy with other poor reproductive or obstetric history, second trimester: Secondary | ICD-10-CM

## 2015-09-11 DIAGNOSIS — Z3A18 18 weeks gestation of pregnancy: Secondary | ICD-10-CM

## 2015-09-11 DIAGNOSIS — O0992 Supervision of high risk pregnancy, unspecified, second trimester: Secondary | ICD-10-CM

## 2015-09-11 DIAGNOSIS — E669 Obesity, unspecified: Secondary | ICD-10-CM

## 2015-09-11 DIAGNOSIS — Z36 Encounter for antenatal screening of mother: Secondary | ICD-10-CM | POA: Diagnosis not present

## 2015-09-11 DIAGNOSIS — F418 Other specified anxiety disorders: Secondary | ICD-10-CM

## 2015-09-11 DIAGNOSIS — O10012 Pre-existing essential hypertension complicating pregnancy, second trimester: Secondary | ICD-10-CM | POA: Insufficient documentation

## 2015-09-11 DIAGNOSIS — O99342 Other mental disorders complicating pregnancy, second trimester: Secondary | ICD-10-CM

## 2015-09-11 DIAGNOSIS — O10919 Unspecified pre-existing hypertension complicating pregnancy, unspecified trimester: Secondary | ICD-10-CM

## 2015-09-11 DIAGNOSIS — Z1389 Encounter for screening for other disorder: Secondary | ICD-10-CM

## 2015-09-11 DIAGNOSIS — O09529 Supervision of elderly multigravida, unspecified trimester: Secondary | ICD-10-CM

## 2015-09-11 LAB — POCT URINALYSIS DIP (DEVICE)
BILIRUBIN URINE: NEGATIVE
GLUCOSE, UA: NEGATIVE mg/dL
Hgb urine dipstick: NEGATIVE
LEUKOCYTES UA: NEGATIVE
Nitrite: NEGATIVE
PH: 6.5 (ref 5.0–8.0)
Protein, ur: NEGATIVE mg/dL
Specific Gravity, Urine: 1.02 (ref 1.005–1.030)
Urobilinogen, UA: 0.2 mg/dL (ref 0.0–1.0)

## 2015-09-11 NOTE — Patient Instructions (Signed)

## 2015-09-11 NOTE — Progress Notes (Signed)
Subjective:  Sherry Jimenez is a 35 y.o. [id number] at 1967w0d being seen today for ongoing prenatal care.  She is currently monitored for the following issues for this high-risk pregnancy and has Obesity; Essential hypertension, benign; Mixed anxiety and depressive disorder; Fibromyalgia; Pregnancy; CAP (community acquired pneumonia); Supervision of high risk pregnancy, antepartum; Chronic hypertension during pregnancy, antepartum; and Advanced maternal age in multigravida on her problem list.  Patient reports no complaints.  Contractions: Not present.  .  Movement: Present. Denies leaking of fluid.   The following portions of the patient's history were reviewed and updated as appropriate: allergies, current medications, past family history, past medical history, past social history, past surgical history and problem list. Problem list updated.  Objective:   Filed Vitals:   09/11/15 1132  BP: 119/79  Pulse: 87  Weight: 236 lb 11.2 oz (107.366 kg)    Fetal Status: Fetal Heart Rate (bpm): 157   Movement: Present     General:  Alert, oriented and cooperative. Patient is in no acute distress.  Skin: Skin is warm and dry. No rash noted.   Cardiovascular: Normal heart rate noted  Respiratory: Normal respiratory effort, no problems with respiration noted  Abdomen: Soft, gravid, appropriate for gestational age. Pain/Pressure: Present     Pelvic:       Cervical exam deferred        Extremities: Normal range of motion.  Edema: Trace  Mental Status: Normal mood and affect. Normal behavior. Normal judgment and thought content.   Urinalysis:      Assessment and Plan:  Pregnancy: [id number] at 167w0d  1. Supervision of high risk pregnancy, antepartum, second trimester US normal  2. Chronic hypertension during pregnancy, antepartum Nl BP  Preterm labor symptoms and general obstetric precautions including but not limited to vaginal bleeding, contractions, leaking of fluid and fetal movement  were reviewed in detail with the patient. Please refer to After Visit Summary for other counseling recommendations.  Return in about 4 weeks (around 10/09/2015).   Adam PhenixJames G Lonetta Blassingame, MD

## 2015-09-30 ENCOUNTER — Telehealth: Payer: Self-pay | Admitting: *Deleted

## 2015-09-30 NOTE — Telephone Encounter (Addendum)
Message received from [facility] stating that pt is requesting refill of Tramadol 50 mg.  Rx refill called to [facility] after consult w/Dr. Constant.

## 2015-10-01 MED ORDER — TRAMADOL HCL 50 MG PO TABS: 50.0000 mg | ORAL_TABLET | Freq: Three times a day (TID) | ORAL | Status: DC | PRN

## 2015-10-04 ENCOUNTER — Other Ambulatory Visit: Payer: Self-pay | Admitting: Family Medicine

## 2015-10-09 ENCOUNTER — Encounter: Payer: Self-pay | Admitting: Family Medicine

## 2015-10-09 ENCOUNTER — Ambulatory Visit (INDEPENDENT_AMBULATORY_CARE_PROVIDER_SITE_OTHER): Payer: BC Managed Care – PPO | Admitting: Family

## 2015-10-09 VITALS — BP 129/79 | HR 77 | Wt 241.1 lb

## 2015-10-09 DIAGNOSIS — O09522 Supervision of elderly multigravida, second trimester: Secondary | ICD-10-CM

## 2015-10-09 DIAGNOSIS — O99212 Obesity complicating pregnancy, second trimester: Secondary | ICD-10-CM

## 2015-10-09 DIAGNOSIS — E669 Obesity, unspecified: Secondary | ICD-10-CM

## 2015-10-09 DIAGNOSIS — M797 Fibromyalgia: Secondary | ICD-10-CM

## 2015-10-09 DIAGNOSIS — H1013 Acute atopic conjunctivitis, bilateral: Secondary | ICD-10-CM

## 2015-10-09 DIAGNOSIS — G4700 Insomnia, unspecified: Secondary | ICD-10-CM

## 2015-10-09 DIAGNOSIS — O0992 Supervision of high risk pregnancy, unspecified, second trimester: Secondary | ICD-10-CM

## 2015-10-09 DIAGNOSIS — O10912 Unspecified pre-existing hypertension complicating pregnancy, second trimester: Secondary | ICD-10-CM

## 2015-10-09 DIAGNOSIS — O10919 Unspecified pre-existing hypertension complicating pregnancy, unspecified trimester: Secondary | ICD-10-CM

## 2015-10-09 DIAGNOSIS — J302 Other seasonal allergic rhinitis: Secondary | ICD-10-CM

## 2015-10-09 DIAGNOSIS — O99210 Obesity complicating pregnancy, unspecified trimester: Secondary | ICD-10-CM | POA: Insufficient documentation

## 2015-10-09 DIAGNOSIS — Z3490 Encounter for supervision of normal pregnancy, unspecified, unspecified trimester: Secondary | ICD-10-CM

## 2015-10-09 LAB — POCT URINALYSIS DIP (DEVICE)
BILIRUBIN URINE: NEGATIVE
GLUCOSE, UA: NEGATIVE mg/dL
Hgb urine dipstick: NEGATIVE
Ketones, ur: NEGATIVE mg/dL
LEUKOCYTES UA: NEGATIVE
NITRITE: NEGATIVE
Protein, ur: NEGATIVE mg/dL
Specific Gravity, Urine: 1.015 (ref 1.005–1.030)
UROBILINOGEN UA: 1 mg/dL (ref 0.0–1.0)
pH: 6.5 (ref 5.0–8.0)

## 2015-10-09 MED ORDER — PRENATAL VITAMINS 0.8 MG PO TABS: 1.0000 | ORAL_TABLET | Freq: Every day | ORAL | Status: DC

## 2015-10-09 MED ORDER — CYCLOBENZAPRINE HCL 5 MG PO TABS: 5.0000 mg | ORAL_TABLET | Freq: Three times a day (TID) | ORAL | Status: DC | PRN

## 2015-10-09 MED ORDER — CETIRIZINE HCL 10 MG PO TABS: 10.0000 mg | ORAL_TABLET | Freq: Every day | ORAL | Status: DC

## 2015-10-09 MED ORDER — FLUTICASONE PROPIONATE 50 MCG/ACT NA SUSP: 2.0000 | Freq: Every day | NASAL | Status: DC

## 2015-10-09 NOTE — Patient Instructions (Addendum)
Safe Medications in Pregnancy   Acne: Benzoyl Peroxide Salicylic Acid  Backache/Headache: Tylenol: 2 regular strength every 4 hours OR              2 Extra strength every 6 hours  Colds/Coughs/Allergies: Benadryl (alcohol free) 25 mg every 6 hours as needed Breath right strips Claritin Cepacol throat lozenges Chloraseptic throat spray Cold-Eeze- up to three times per day Cough drops, alcohol free Flonase (by prescription only) Guaifenesin Mucinex Robitussin DM (plain only, alcohol free) Saline nasal spray/drops Sudafed (pseudoephedrine) & Actifed ** use only after [redacted] weeks gestation and if you do not have high blood pressure Tylenol Vicks Vaporub Zinc lozenges Zyrtec   Constipation: Colace Ducolax suppositories Fleet enema Glycerin suppositories Metamucil Milk of magnesia Miralax Senokot Smooth move tea  Diarrhea: Kaopectate Imodium A-D  *NO pepto Bismol  Hemorrhoids: Anusol Anusol HC Preparation H Tucks  Indigestion: Tums Maalox Mylanta Zantac  Pepcid  Insomnia: Benadryl (alcohol free)  every 6 hours as needed Tylenol PM Unisom, no Gelcaps  Leg Cramps: Tums MagGel  Nausea/Vomiting:  Bonine Dramamine Emetrol Ginger extract Sea bands Meclizine  Nausea medication to take during pregnancy:  Unisom (doxylamine succinate 25 mg tablets) Take one tablet daily at bedtime. If symptoms are not adequately controlled, the dose can be increased to a maximum recommended dose of two tablets daily (1/2 tablet in the morning, 1/2 tablet mid-afternoon and one at bedtime). Vitamin B6  tablets. Take one tablet twice a day (up to 200 mg per day).  Skin Rashes: Aveeno products Benadryl cream or  every 6 hours as needed Calamine Lotion 1% cortisone cream  Yeast infection: Gyne-lotrimin 7 Monistat 7   **If taking multiple medications, please check labels to avoid duplicating the same active ingredients **take medication as directed on  the label ** Do not exceed 4000 mg of tylenol in 24 hours **Do not take medications that contain aspirin or ibuprofen       Allergic Rhinitis Allergic rhinitis is when the mucous membranes in the nose respond to allergens. Allergens are particles in the air that cause your body to have an allergic reaction. This causes you to release allergic antibodies. Through a chain of events, these eventually cause you to release histamine into the blood stream. Although meant to protect the body, it is this release of histamine that causes your discomfort, such as frequent sneezing, congestion, and an itchy, runny nose.  CAUSES Seasonal allergic rhinitis (hay fever) is caused by pollen allergens that may come from grasses, trees, and weeds. Year-round allergic rhinitis (perennial allergic rhinitis) is caused by allergens such as house dust mites, pet dander, and mold spores. SYMPTOMS  Nasal stuffiness (congestion).  Itchy, runny nose with sneezing and tearing of the eyes. DIAGNOSIS Your health care provider can help you determine the allergen or allergens that trigger your symptoms. If you and your health care provider are unable to determine the allergen, skin or blood testing may be used. Your health care provider will diagnose your condition after taking your health history and performing a physical exam. Your health care provider may assess you for other related conditions, such as asthma, pink eye, or an ear infection. TREATMENT Allergic rhinitis does not have a cure, but it can be controlled by:  Medicines that block allergy symptoms. These may include allergy shots, nasal sprays, and oral antihistamines.  Avoiding the allergen. Hay fever may often be treated with antihistamines in pill or nasal spray forms. Antihistamines block the effects of histamine. There are  over-the-counter medicines that may help with nasal congestion and swelling around the eyes. Check with your health care provider  before taking or giving this medicine. If avoiding the allergen or the medicine prescribed do not work, there are many new medicines your health care provider can prescribe. Stronger medicine may be used if initial measures are ineffective. Desensitizing injections can be used if medicine and avoidance does not work. Desensitization is when a patient is given ongoing shots until the body becomes less sensitive to the allergen. Make sure you follow up with your health care provider if problems continue. HOME CARE INSTRUCTIONS It is not possible to completely avoid allergens, but you can reduce your symptoms by taking steps to limit your exposure to them. It helps to know exactly what you are allergic to so that you can avoid your specific triggers. SEEK MEDICAL CARE IF:  You have a fever.  You develop a cough that does not stop easily (persistent).  You have shortness of breath.  You start wheezing.  Symptoms interfere with normal daily activities.   This information is not intended to replace advice given to you by your health care provider. Make sure you discuss any questions you have with your health care provider.   Document Released: 01/19/2001 Document Revised: 05/17/2014 Document Reviewed: 01/01/2013 Elsevier Interactive Patient Education [employer]2016 Elsevier Inc.

## 2015-10-09 NOTE — Progress Notes (Signed)
Pt is requesting a note for work regarding lifting. Refill Zyrtec

## 2015-10-09 NOTE — Progress Notes (Signed)
Subjective:  Sherry Jimenez is a [age] y.o. [id number] at 2225w0d being seen today for ongoing prenatal care.  She is currently monitored for the following issues for this high-risk pregnancy and has Obesity; Essential hypertension, benign; Mixed anxiety and depressive disorder; Fibromyalgia; Supervision of high risk pregnancy, antepartum; Chronic hypertension during pregnancy, antepartum; Advanced maternal age in multigravida; and Obesity affecting pregnancy, antepartum on her problem list.  Patient reports no complaints.  Contractions: Not present.  .  Movement: Present. Denies leaking of fluid.   The following portions of the patient's history were reviewed and updated as appropriate: allergies, current medications, past family history, past medical history, past social history, past surgical history and problem list. Problem list updated.  Objective:   Filed Vitals:   10/09/15 0814  BP: 129/79  Pulse: 77  Weight: 241 lb 1.6 oz (109.362 kg)    Fetal Status: Fetal Heart Rate (bpm): 160   Movement: Present     General:  Alert, oriented and cooperative. Patient is in no acute distress.  Skin: Skin is warm and dry. No rash noted.   Cardiovascular: Normal heart rate noted  Respiratory: Normal respiratory effort, no problems with respiration noted  Abdomen: Soft, gravid, appropriate for gestational age. Pain/Pressure: Present     Pelvic:       Cervical exam deferred        Extremities: Normal range of motion.     Mental Status: Normal mood and affect. Normal behavior. Normal judgment and thought content.   Urinalysis:      Assessment and Plan:  Pregnancy: G5P3013 at 3225w0d  1. Chronic hypertension during pregnancy, antepartum -BP well controlled today  2. Advanced maternal age in multigravida, second trimester - genetic screenings wnl  3. Supervision of high risk pregnancy, antepartum, second trimester Updated box, and utd on pnc Gave work note today  4. Obesity affecting  pregnancy, antepartum, second trimester Will need growth scans due to difficulty with fundal height  Preterm labor symptoms and general obstetric precautions including but not limited to vaginal bleeding, contractions, leaking of fluid and fetal movement were reviewed in detail with the patient. Please refer to After Visit Summary for other counseling recommendations.  Return in about 4 weeks (around 11/06/2015) for Routine prenatal care.   Federico FlakeKimberly Niles Jarrin Staley, MD

## 2015-10-26 ENCOUNTER — Other Ambulatory Visit: Payer: Self-pay | Admitting: Obstetrics and Gynecology

## 2015-11-06 ENCOUNTER — Encounter: Payer: BC Managed Care – PPO | Admitting: Family

## 2015-11-17 ENCOUNTER — Inpatient Hospital Stay (HOSPITAL_COMMUNITY)
Admission: AD | Admit: 2015-11-17 | Discharge: 2015-11-17 | Disposition: A | Discharge status: Home / Self Care | Payer: BC Managed Care – PPO | Source: Ambulatory Visit | Attending: Obstetrics & Gynecology | Admitting: Obstetrics & Gynecology

## 2015-11-17 ENCOUNTER — Encounter (HOSPITAL_COMMUNITY): Payer: Self-pay | Admitting: *Deleted

## 2015-11-17 ENCOUNTER — Other Ambulatory Visit: Payer: Self-pay | Admitting: Family Medicine

## 2015-11-17 DIAGNOSIS — Z87440 Personal history of urinary (tract) infections: Secondary | ICD-10-CM | POA: Diagnosis not present

## 2015-11-17 DIAGNOSIS — O9989 Other specified diseases and conditions complicating pregnancy, childbirth and the puerperium: Secondary | ICD-10-CM

## 2015-11-17 DIAGNOSIS — Z833 Family history of diabetes mellitus: Secondary | ICD-10-CM | POA: Insufficient documentation

## 2015-11-17 DIAGNOSIS — Z881 Allergy status to other antibiotic agents status: Secondary | ICD-10-CM | POA: Insufficient documentation

## 2015-11-17 DIAGNOSIS — R197 Diarrhea, unspecified: Secondary | ICD-10-CM

## 2015-11-17 DIAGNOSIS — M797 Fibromyalgia: Secondary | ICD-10-CM | POA: Diagnosis not present

## 2015-11-17 DIAGNOSIS — O26892 Other specified pregnancy related conditions, second trimester: Secondary | ICD-10-CM | POA: Insufficient documentation

## 2015-11-17 DIAGNOSIS — O99332 Smoking (tobacco) complicating pregnancy, second trimester: Secondary | ICD-10-CM | POA: Diagnosis not present

## 2015-11-17 DIAGNOSIS — Z7982 Long term (current) use of aspirin: Secondary | ICD-10-CM | POA: Insufficient documentation

## 2015-11-17 DIAGNOSIS — F17210 Nicotine dependence, cigarettes, uncomplicated: Secondary | ICD-10-CM | POA: Diagnosis not present

## 2015-11-17 DIAGNOSIS — Z880 Allergy status to penicillin: Secondary | ICD-10-CM | POA: Insufficient documentation

## 2015-11-17 DIAGNOSIS — Z3A27 27 weeks gestation of pregnancy: Secondary | ICD-10-CM

## 2015-11-17 LAB — COMPREHENSIVE METABOLIC PANEL
ALT: 20 U/L (ref 14–54)
AST: 18 U/L (ref 15–41)
Albumin: 3 g/dL — ABNORMAL LOW (ref 3.5–5.0)
Alkaline Phosphatase: 76 U/L (ref 38–126)
Anion gap: 9 (ref 5–15)
BUN: 5 mg/dL — AB (ref 6–20)
CHLORIDE: 105 mmol/L (ref 101–111)
CO2: 22 mmol/L (ref 22–32)
CREATININE: 0.49 mg/dL (ref 0.44–1.00)
Calcium: 9.4 mg/dL (ref 8.9–10.3)
GFR calc Af Amer: >60 mL/min (ref >60)
Glucose, Bld: 132 mg/dL — ABNORMAL HIGH (ref 65–99)
POTASSIUM: 3.7 mmol/L (ref 3.5–5.1)
Sodium: 136 mmol/L (ref 135–145)
Total Bilirubin: 0.3 mg/dL (ref 0.3–1.2)
Total Protein: 7 g/dL (ref 6.5–8.1)

## 2015-11-17 LAB — CBC
HCT: 31.4 % — ABNORMAL LOW (ref 36.0–46.0)
Hemoglobin: 10.9 g/dL — ABNORMAL LOW (ref 12.0–15.0)
MCH: 27.1 pg (ref 26.0–34.0)
MCHC: 34.7 g/dL (ref 30.0–36.0)
MCV: 78.1 fL (ref 78.0–100.0)
PLATELETS: 214 10*3/uL (ref 150–400)
RBC: 4.02 MIL/uL (ref 3.87–5.11)
RDW: 14.2 % (ref 11.5–15.5)
WBC: 9.6 10*3/uL (ref 4.0–10.5)

## 2015-11-17 LAB — URINALYSIS, ROUTINE W REFLEX MICROSCOPIC
BILIRUBIN URINE: NEGATIVE
Glucose, UA: NEGATIVE mg/dL
HGB URINE DIPSTICK: NEGATIVE
Ketones, ur: NEGATIVE mg/dL
Leukocytes, UA: NEGATIVE
Nitrite: NEGATIVE
PROTEIN: NEGATIVE mg/dL
SPECIFIC GRAVITY, URINE: 1.01 (ref 1.005–1.030)
pH: 6 (ref 5.0–8.0)

## 2015-11-17 LAB — C DIFFICILE QUICK SCREEN W PCR REFLEX
C DIFFICILE (CDIFF) TOXIN: NEGATIVE
C Diff antigen: NEGATIVE
C Diff interpretation: NOT DETECTED

## 2015-11-17 NOTE — MAU Note (Signed)
Patient presents to mau with c/o diarrhea, soft or loose stools consistency for 1 week multiple times a day, per patient. States has not been around anyone sick nor tried any OTC medications. Endorse being directed by clinic nurse line to come in.

## 2015-11-17 NOTE — MAU Provider Note (Signed)
MAU HISTORY AND PHYSICAL  Chief Complaint:  Diarrhea   Sherry Jimenez is a 35 y.o.  [id number] with IUP at [redacted]w[redacted]d presenting for Diarrhea This began 1 week ago. She did not change her diet or any medicines. She denies any sick contacts. She has multiple bouts of loose and watery stool, that is occasionally frothy and slightly formed after each time she eats. She does not have any diarrhea or loose stools as long as she hasn't eaten recently. She has mild RLQ pain with episodes of diarrhea. Earlier in the onset of this illness, she did have one episode of nausea and chills. No vomiting. She denies any fever. Patient states she has been having  irregular, every 10-30 minutes contractions, none vaginal bleeding, intact membranes, with active fetal movement.    Past Medical History  Diagnosis Date  . Anemia   . Gestational diabetes   . UTI (lower urinary tract infection)   . BV (bacterial vaginosis)   . Cholelithiasis   . Vaginal yeast infection   . Hypertension   . Fibromyalgia     Past Surgical History  Procedure Laterality Date  . Cholecystectomy    . Tonsilectomy, adenoidectomy, bilateral myringotomy and tubes      Family History  Problem Relation Age of Onset  . Diabetes Father   . Alcohol abuse Father     Social History  Substance Use Topics  . Smoking status: Current Some Day Smoker -- 0.25 packs/day for 12 years  . Smokeless tobacco: None     Comment: electronic cigarette  . Alcohol Use: No     Comment: social    Allergies  Allergen Reactions  . Azithromycin Other (See Comments)    Vomiting   . Penicillins Nausea And Vomiting    Has patient had a PCN reaction causing immediate rash, facial/tongue/throat swelling, SOB or lightheadedness with hypotension: No Has patient had a PCN reaction causing severe rash involving mucus membranes or skin necrosis: No Has patient had a PCN reaction that required hospitalization No Has patient had a PCN reaction occurring  within the last 10 years: No If all of the above answers are "NO", then may proceed with Cephalosporin use.    Prescriptions prior to admission  Medication Sig Dispense Refill Last Dose  . acetaminophen (TYLENOL) 500 MG tablet Take 1,000 mg by mouth every 6 (six) hours as needed for mild pain. Reported on 10/09/2015   Not Taking  . aspirin EC 81 MG tablet Take 1 tablet (81 mg total) by mouth daily. Take after 12 weeks for prevention of preeclampsia later in pregnancy 300 tablet 2 Taking  . cetirizine (ZYRTEC) 10 MG tablet Take 1 tablet (10 mg total) by mouth daily. 90 tablet 3   . cyclobenzaprine (FLEXERIL) 5 MG tablet Take 1 tablet (5 mg total) by mouth 3 (three) times daily as needed for muscle spasms. 90 tablet 0   . fluticasone (FLONASE) 50 MCG/ACT nasal spray Place 2 sprays into both nostrils daily. 16 g 6   . Prenatal Multivit-Min-Fe-FA (PRENATAL VITAMINS) 0.8 MG tablet Take 1 tablet by mouth daily. 30 tablet 12   . promethazine (PHENERGAN) 25 MG tablet Take 1 tablet (25 mg total) by mouth every 6 (six) hours as needed for nausea or vomiting. 30 tablet 1 Taking  . traMADol (ULTRAM) 50 MG tablet Take 1 tablet (50 mg total) by mouth 3 (three) times daily as needed for severe pain. 30 tablet 0 Taking    Review of Systems - Negative  except for what is mentioned in HPI.  Physical Exam  Blood pressure 119/77, pulse 116, temperature 98.1 F (36.7 C), temperature source Oral, resp. rate 18, last menstrual period 05/08/2015, SpO2 100 %. GENERAL: Well-developed, well-nourished female in no acute distress.  LUNGS: Clear to auscultation bilaterally.  HEART: Regular rate and rhythm. ABDOMEN: Soft, nontender, nondistended, gravid.  EXTREMITIES: Nontender, no edema, 2+ distal pulses. FHT:  Cat 1 Contractions: irregular   Labs: No results found for this or any previous visit (from the past 24 hour(s)).  Imaging Studies:  No results found.  Assessment: Sherry QuinonesShanita S Jimenez is  35 y.o.  [id number] at 542w4d presents with Diarrhea . She is resting comfortably and does not feel urge to defecate at this time.   Plan: Diarrhea: Pt resting comfortably, symptoms occur between 3-5 times per day. Possible Viral gastroenteritis vs IBS vs infectious diarrhea. -UA  -CBC, CMP -C diff and stool ova and parasite  Loni MuseKate Timberlake 7/10/201711:21 AM  I have seen this patient and agree with the above resident's note.  LEFTWICH-KIRBY, Jake Goodson Certified Nurse-Midwife

## 2015-11-17 NOTE — Discharge Instructions (Signed)
You may take Imodium over-the-counter and follow the package directions.    Diarrhea Diarrhea is frequent loose and watery bowel movements. It can cause you to feel weak and dehydrated. Dehydration can cause you to become tired and thirsty, have a dry mouth, and have decreased urination that often is dark yellow. Diarrhea is a sign of another problem, most often an infection that will not last long. In most cases, diarrhea typically lasts 2-3 days. However, it can last longer if it is a sign of something more serious. It is important to treat your diarrhea as directed by your caregiver to lessen or prevent future episodes of diarrhea. CAUSES  Some common causes include:  Gastrointestinal infections caused by viruses, bacteria, or parasites.  Food poisoning or food allergies.  Certain medicines, such as antibiotics, chemotherapy, and laxatives.  Artificial sweeteners and fructose.  Digestive disorders. HOME CARE INSTRUCTIONS  Ensure adequate fluid intake (hydration): Have 1 cup (8 oz) of fluid for each diarrhea episode. Avoid fluids that contain simple sugars or sports drinks, fruit juices, whole milk products, and sodas. Your urine should be clear or pale yellow if you are drinking enough fluids. Hydrate with an oral rehydration solution that you can purchase at pharmacies, retail stores, and online. You can prepare an oral rehydration solution at home by mixing the following ingredients together:   - tsp table salt.   tsp baking soda.   tsp salt substitute containing potassium chloride.  1  tablespoons sugar.  1 L (34 oz) of water.  Certain foods and beverages may increase the speed at which food moves through the gastrointestinal (GI) tract. These foods and beverages should be avoided and include:  Caffeinated and alcoholic beverages.  High-fiber foods, such as raw fruits and vegetables, nuts, seeds, and whole grain breads and cereals.  Foods and beverages sweetened with sugar  alcohols, such as xylitol, sorbitol, and mannitol.  Some foods may be well tolerated and may help thicken stool including:  Starchy foods, such as rice, toast, pasta, low-sugar cereal, oatmeal, grits, baked potatoes, crackers, and bagels.  Bananas.  Applesauce.  Add probiotic-rich foods to help increase healthy bacteria in the GI tract, such as yogurt and fermented milk products.  Wash your hands well after each diarrhea episode.  Only take over-the-counter or prescription medicines as directed by your caregiver.  Take a warm bath to relieve any burning or pain from frequent diarrhea episodes. SEEK IMMEDIATE MEDICAL CARE IF:   You are unable to keep fluids down.  You have persistent vomiting.  You have blood in your stool, or your stools are black and tarry.  You do not urinate in 6-8 hours, or there is only a small amount of very dark urine.  You have abdominal pain that increases or localizes.  You have weakness, dizziness, confusion, or light-headedness.  You have a severe headache.  Your diarrhea gets worse or does not get better.  You have a fever or persistent symptoms for more than 2-3 days.  You have a fever and your symptoms suddenly get worse. MAKE SURE YOU:   Understand these instructions.  Will watch your condition.  Will get help right away if you are not doing well or get worse.   This information is not intended to replace advice given to you by your health care provider. Make sure you discuss any questions you have with your health care provider.   Document Released: 04/16/2002 Document Revised: 05/17/2014 Document Reviewed: 01/02/2012 Elsevier Interactive Patient Education 2016 Elsevier  Inc. ° °

## 2015-11-17 NOTE — MAU Note (Signed)
Urine in lab 

## 2015-11-18 ENCOUNTER — Telehealth: Payer: Self-pay | Admitting: *Deleted

## 2015-11-18 NOTE — Telephone Encounter (Signed)
Pt called and stated that she used to take tramadol for her fibromyalgia. Advised patient to discuss pain meds at her next visit. Pt agreeable.

## 2015-11-18 NOTE — Telephone Encounter (Signed)
Pt wants a refill on her rx. Called patient back and asked her to let us know what rx she is needing.

## 2015-11-24 ENCOUNTER — Ambulatory Visit (INDEPENDENT_AMBULATORY_CARE_PROVIDER_SITE_OTHER): Payer: BC Managed Care – PPO | Admitting: Obstetrics & Gynecology

## 2015-11-24 VITALS — BP 123/83 | HR 93 | Wt 253.1 lb

## 2015-11-24 DIAGNOSIS — O10913 Unspecified pre-existing hypertension complicating pregnancy, third trimester: Secondary | ICD-10-CM

## 2015-11-24 DIAGNOSIS — O0993 Supervision of high risk pregnancy, unspecified, third trimester: Secondary | ICD-10-CM

## 2015-11-24 DIAGNOSIS — R52 Pain, unspecified: Secondary | ICD-10-CM

## 2015-11-24 DIAGNOSIS — I10 Essential (primary) hypertension: Secondary | ICD-10-CM

## 2015-11-24 LAB — POCT URINALYSIS DIP (DEVICE)
BILIRUBIN URINE: NEGATIVE
Glucose, UA: NEGATIVE mg/dL
HGB URINE DIPSTICK: NEGATIVE
KETONES UR: NEGATIVE mg/dL
LEUKOCYTES UA: NEGATIVE
NITRITE: NEGATIVE
PH: 6 (ref 5.0–8.0)
Protein, ur: NEGATIVE mg/dL
Specific Gravity, Urine: 1.01 (ref 1.005–1.030)
Urobilinogen, UA: 0.2 mg/dL (ref 0.0–1.0)

## 2015-11-24 LAB — CBC
HCT: 31.9 % — ABNORMAL LOW (ref 35.0–45.0)
HEMOGLOBIN: 10.6 g/dL — AB (ref 11.7–15.5)
MCH: 26.8 pg — AB (ref 27.0–33.0)
MCHC: 33.2 g/dL (ref 32.0–36.0)
MCV: 80.8 fL (ref 80.0–100.0)
MPV: 10.1 fL (ref 7.5–12.5)
PLATELETS: 183 10*3/uL (ref 140–400)
RBC: 3.95 MIL/uL (ref 3.80–5.10)
RDW: 14.7 % (ref 11.0–15.0)
WBC: 8.3 10*3/uL (ref 3.8–10.8)

## 2015-11-24 MED ORDER — ZOLPIDEM TARTRATE 10 MG PO TABS: ORAL_TABLET | ORAL | Status: DC

## 2015-11-24 MED ORDER — TETANUS-DIPHTH-ACELL PERTUSSIS 5-2.5-18.5 LF-MCG/0.5 IM SUSP
0.5000 mL | Freq: Once | INTRAMUSCULAR | Status: AC
  Administered 2015-11-24: 0.5 mL via INTRAMUSCULAR

## 2015-11-24 NOTE — Progress Notes (Signed)
Subjective:  Sherry Jimenez is a 35 y.o. [id number] at 2462w4d being seen today for ongoing prenatal care.  She is currently monitored for the following issues for this high-risk pregnancy and has Obesity; Essential hypertension, benign; Mixed anxiety and depressive disorder; Fibromyalgia; Supervision of high risk pregnancy, antepartum; Chronic hypertension during pregnancy, antepartum; Advanced maternal age in multigravida; Obesity affecting pregnancy, antepartum; BP (high blood pressure); and Cannabis abuse on her problem list.  Patient reports leg pain makign sleep difficult, pain is uppr thighs, hard to tun over in bed, was supposed to take Lyrica for Fibromyalgia pain  but is pregnant.  .  Contractions: Not present. Vag. Bleeding: None.  Movement: Present. Denies leaking of fluid.   The following portions of the patient's history were reviewed and updated as appropriate: allergies, current medications, past family history, past medical history, past social history, past surgical history and problem list. Problem list updated.  Objective:   Filed Vitals:   11/24/15 0757  BP: 123/83  Pulse: 93  Weight: 253 lb 1.6 oz (114.805 kg)    Fetal Status: Fetal Heart Rate (bpm): 147   Movement: Present     General:  Alert, oriented and cooperative. Patient is in no acute distress.  Skin: Skin is warm and dry. No rash noted.   Cardiovascular: Normal heart rate noted  Respiratory: Normal respiratory effort, no problems with respiration noted  Abdomen: Soft, gravid, appropriate for gestational age. Pain/Pressure: Present     Pelvic:  Cervical exam deferred        Extremities: Normal range of motion.  Edema: None  Mental Status: Normal mood and affect. Normal behavior. Normal judgment and thought content.   Urinalysis: Urine Protein: Negative Urine Glucose: Negative  Assessment and Plan:  Pregnancy: [id number] at 4162w4d  1. Supervision of high-risk pregnancy, third trimester - Glucose  Tolerance, 1 HR (50g) w/o Fasting - RPR - CBC - HIV antibody (with reflex)  2. Body aches/Fibromyalgia --Ambien for sleep - AMB referral to sports medicine  3. Chronic hypertension complicating or reason for care during pregnancy, third trimester - US MFM OB FOLLOW UP; Future  Preterm labor symptoms and general obstetric precautions including but not limited to vaginal bleeding, contractions, leaking of fluid and fetal movement were reviewed in detail with the patient. Please refer to After Visit Summary for other counseling recommendations.  Return in about 2 weeks (around 12/08/2015).   Lesly DukesKelly H Kahlen Morais, MD

## 2015-11-24 NOTE — Progress Notes (Signed)
Pt questions breastfeeding and the medications that she needs to take post delivery 28 wk labs today  tdap vaccine given

## 2015-11-25 LAB — RPR

## 2015-11-25 LAB — GLUCOSE TOLERANCE, 1 HOUR (50G) W/O FASTING: GLUCOSE, 1 HR, GESTATIONAL: 188 mg/dL — AB (ref <140)

## 2015-11-25 LAB — HIV ANTIBODY (ROUTINE TESTING W REFLEX): HIV: NONREACTIVE

## 2015-12-09 ENCOUNTER — Telehealth: Payer: Self-pay | Admitting: *Deleted

## 2015-12-09 NOTE — Telephone Encounter (Signed)
Called pt and left message stating that I am returning her call from last week to answer her questions. We apologize for the delay.  If she needs answers today, please call back and leave a message on the nurse voice mail. Otherwise, her questions can be answered at her scheduled appt tomorrow.

## 2015-12-10 ENCOUNTER — Ambulatory Visit (HOSPITAL_COMMUNITY): Admission: RE | Admit: 2015-12-10 | Payer: BC Managed Care – PPO | Source: Ambulatory Visit

## 2015-12-10 ENCOUNTER — Encounter: Payer: BC Managed Care – PPO | Admitting: Obstetrics and Gynecology

## 2015-12-11 ENCOUNTER — Ambulatory Visit (INDEPENDENT_AMBULATORY_CARE_PROVIDER_SITE_OTHER): Payer: BC Managed Care – PPO | Admitting: Obstetrics and Gynecology

## 2015-12-11 ENCOUNTER — Other Ambulatory Visit: Payer: BC Managed Care – PPO

## 2015-12-11 VITALS — BP 124/85 | HR 92 | Wt 252.0 lb

## 2015-12-11 DIAGNOSIS — R7309 Other abnormal glucose: Secondary | ICD-10-CM

## 2015-12-11 DIAGNOSIS — O10912 Unspecified pre-existing hypertension complicating pregnancy, second trimester: Secondary | ICD-10-CM

## 2015-12-11 DIAGNOSIS — O10919 Unspecified pre-existing hypertension complicating pregnancy, unspecified trimester: Secondary | ICD-10-CM

## 2015-12-11 DIAGNOSIS — O0993 Supervision of high risk pregnancy, unspecified, third trimester: Secondary | ICD-10-CM

## 2015-12-11 LAB — POCT URINALYSIS DIP (DEVICE)
GLUCOSE, UA: 250 mg/dL — AB
Hgb urine dipstick: NEGATIVE
KETONES UR: NEGATIVE mg/dL
Leukocytes, UA: NEGATIVE
Nitrite: NEGATIVE
Protein, ur: 30 mg/dL — AB
SPECIFIC GRAVITY, URINE: 1.025 (ref 1.005–1.030)
Urobilinogen, UA: 1 mg/dL (ref 0.0–1.0)
pH: 6 (ref 5.0–8.0)

## 2015-12-11 NOTE — Progress Notes (Signed)
Subjective:  Sherry Jimenez is a 35 y.o. [id number] at [redacted]w[redacted]d being seen today for ongoing prenatal care.  She is currently monitored for the following issues for this high-risk pregnancy and has Obesity; Essential hypertension, benign; Mixed anxiety and depressive disorder; Fibromyalgia; Supervision of high risk pregnancy, antepartum; Chronic hypertension during pregnancy, antepartum; Advanced maternal age in multigravida; Obesity affecting pregnancy, antepartum; BP (high blood pressure); and Cannabis abuse on her problem list.  Patient reports no complaints.  Contractions: Irritability. Vag. Bleeding: None.  Movement: Present. Denies leaking of fluid.   Pt did have an abnormal 1 hour glucola. She has since had a 3 hours ordered and is completing it today. She did have A1GDM with one of her prevous pregnancies, but not the last one. She is familiar with glucose testing.    She does want a tubal ligation and will sign papers today.   The following portions of the patient's history were reviewed and updated as appropriate: allergies, current medications, past family history, past medical history, past social history, past surgical history and problem list. Problem list updated.  Objective:   Vitals:   12/11/15 0945  BP: 124/85  Pulse: 92  Weight: 252 lb (114.3 kg)    Fetal Status: Fetal Heart Rate (bpm): 157 Fundal Height: 31 cm Movement: Present     General:  Alert, oriented and cooperative. Patient is in no acute distress.  Skin: Skin is warm and dry. No rash noted.   Cardiovascular: Normal heart rate noted  Respiratory: Normal respiratory effort, no problems with respiration noted  Abdomen: Soft, gravid, appropriate for gestational age. Pain/Pressure: Present     Pelvic:  Cervical exam deferred        Extremities: Normal range of motion.  Edema: None  Mental Status: Normal mood and affect. Normal behavior. Normal judgment and thought content.   Urinalysis:     trace protein, 1+  glucose  Assessment and Plan:  Pregnancy: G5P3013 at [redacted]w[redacted]d  1. Abnormal GTT (glucose tolerance test) - Glucose tolerance, 3 hours -awaiting results. If positive will call in glucometer, pt known dietary modification and will check sugars then follow up  2. Chronic hypertension during pregnancy, antepartum BP ok today. Continue ASA  3. Supervision of high risk pregnancy, antepartum, third trimester Continue routine care. F/u in 2 wks. Tubal papers signed today.  Preterm labor symptoms and general obstetric precautions including but not limited to vaginal bleeding, contractions, leaking of fluid and fetal movement were reviewed in detail with the patient. Please refer to After Visit Summary for other counseling recommendations.  Return in about 2 weeks (around 12/25/2015) for HROB.   Lorne Skeens, MD

## 2015-12-11 NOTE — Patient Instructions (Signed)
Gestational Diabetes Mellitus  Gestational diabetes mellitus, often simply referred to as gestational diabetes, is a type of diabetes that some women develop during pregnancy. In gestational diabetes, the pancreas does not make enough insulin (a hormone), the cells are less responsive to the insulin that is made (insulin resistance), or both. Normally, insulin moves sugars from food into the tissue cells. The tissue cells use the sugars for energy. The lack of insulin or the lack of normal response to insulin causes excess sugars to build up in the blood instead of going into the tissue cells. As a result, high blood sugar (hyperglycemia) develops. The effect of high sugar (glucose) levels can cause many problems.   RISK FACTORS  You have an increased chance of developing gestational diabetes if you have a family history of diabetes and also have one or more of the following risk factors:  · A body mass index over 30 (obesity).  · A previous pregnancy with gestational diabetes.  · An older age at the time of pregnancy.  If blood glucose levels are kept in the normal range during pregnancy, women can have a healthy pregnancy. If your blood glucose levels are not well controlled, there may be risks to you, your unborn baby (fetus), your labor and delivery, or your newborn baby.   SYMPTOMS   If symptoms are experienced, they are much like symptoms you would normally expect during pregnancy. The symptoms of gestational diabetes include:   · Increased thirst (polydipsia).  · Increased urination (polyuria).  · Increased urination during the night (nocturia).  · Weight loss. This weight loss may be rapid.  · Frequent, recurring infections.  · Tiredness (fatigue).  · Weakness.  · Vision changes, such as blurred vision.  · Fruity smell to your breath.  · Abdominal pain.  DIAGNOSIS  Diabetes is diagnosed when blood glucose levels are increased. Your blood glucose level may be checked by one or more of the following blood  tests:  · A fasting blood glucose test. You will not be allowed to eat for at least 8 hours before a blood sample is taken.  · A random blood glucose test. Your blood glucose is checked at any time of the day regardless of when you ate.  · An oral glucose tolerance test (OGTT). Your blood glucose is measured after you have not eaten (fasted) for 1-3 hours and then after you drink a glucose-containing beverage. Since the hormones that cause insulin resistance are highest at about 24-28 weeks of a pregnancy, an OGTT is usually performed during that time. If you have risk factors, you may be screened for undiagnosed type 2 diabetes at your first prenatal visit.  TREATMENT   Gestational diabetes should be managed first with diet and exercise. Medicines may be added only if they are needed.  · You will need to take diabetes medicine or insulin daily to keep blood glucose levels in the desired range.  · You will need to match insulin dosing with exercise and healthy food choices.  If you have gestational diabetes, your treatment goal is to maintain the following blood glucose levels:  · Before meals (preprandial): at or below 95 mg/dL.  · After meals (postprandial):    One hour after a meal: at or below 140 mg/dL.    Two hours after a meal: at or below 120 mg/dL.  If you have pre-existing type 1 or type 2 diabetes, your treatment goal is to maintain the following blood glucose levels:  · Before   meals, at bedtime, and overnight: 60-99 mg/dL.  · After meals: peak of 100-129 mg/dL.  HOME CARE INSTRUCTIONS   · Have your hemoglobin A1c level checked twice a year.  · Perform daily blood glucose monitoring as directed by your health care provider. It is common to perform frequent blood glucose monitoring.  · Monitor urine ketones when you are ill and as directed by your health care provider.  · Take your diabetes medicine and insulin as directed by your health care provider to maintain your blood glucose level in the desired  range.  ¨ Never run out of diabetes medicine or insulin. It is needed every day.  ¨ Adjust insulin based on your intake of carbohydrates. Carbohydrates can raise blood glucose levels but need to be included in your diet. Carbohydrates provide vitamins, minerals, and fiber which are an essential part of a healthy diet. Carbohydrates are found in fruits, vegetables, whole grains, dairy products, legumes, and foods containing added sugars.  · Eat healthy foods. Alternate 3 meals with 3 snacks.  · Maintain a healthy weight gain. The usual total expected weight gain varies according to your prepregnancy body mass index (BMI).  · Carry a medical alert card or wear your medical alert jewelry.  · Carry a 15-gram carbohydrate snack with you at all times to treat low blood glucose (hypoglycemia). Some examples of 15-gram carbohydrate snacks include:  ¨ Glucose tablets, 3 or 4.  ¨ Glucose gel, 15-gram tube.  ¨ Raisins, 2 tablespoons (24 g).  ¨ Jelly beans, 6.  ¨ Animal crackers, 8.  ¨ Fruit juice, regular soda, or low-fat milk, 4 ounces (120 mL).  ¨ Gummy treats, 9.  · Recognize hypoglycemia. Hypoglycemia during pregnancy occurs with blood glucose levels of 60 mg/dL and below. The risk for hypoglycemia increases when fasting or skipping meals, during or after intense exercise, and during sleep. Hypoglycemia symptoms can include:  ¨ Tremors or shakes.  ¨ Decreased ability to concentrate.  ¨ Sweating.  ¨ Increased heart rate.  ¨ Headache.  ¨ Dry mouth.  ¨ Hunger.  ¨ Irritability.  ¨ Anxiety.  ¨ Restless sleep.  ¨ Altered speech or coordination.  ¨ Confusion.  · Treat hypoglycemia promptly. If you are alert and able to safely swallow, follow the 15:15 rule:  ¨ Take 15-20 grams of rapid-acting glucose or carbohydrate. Rapid-acting options include glucose gel, glucose tablets, or 4 ounces (120 mL) of fruit juice, regular soda, or low-fat milk.  ¨ Check your blood glucose level 15 minutes after taking the glucose.  ¨ Take 15-20  grams more of glucose if the repeat blood glucose level is still 70 mg/dL or below.  ¨ Eat a meal or snack within 1 hour once blood glucose levels return to normal.  · Be alert to polyuria (excess urination) and polydipsia (excess thirst) which are early signs of hyperglycemia. An early awareness of hyperglycemia allows for prompt treatment. Treat hyperglycemia as directed by your health care provider.  · Engage in at least 30 minutes of physical activity a day or as directed by your health care provider. Ten minutes of physical activity timed 30 minutes after each meal is encouraged to control postprandial blood glucose levels.  · Adjust your insulin dosing and food intake as needed if you start a new exercise or sport.  · Follow your sick-day plan at any time you are unable to eat or drink as usual.  · Avoid tobacco and alcohol use.  · Keep all follow-up visits as directed   by your health care provider.  · Follow the advice of your health care provider regarding your prenatal and post-delivery (postpartum) appointments, meal planning, exercise, medicines, vitamins, blood tests, other medical tests, and physical activities.  · Perform daily skin and foot care. Examine your skin and feet daily for cuts, bruises, redness, nail problems, bleeding, blisters, or sores.  · Brush your teeth and gums at least twice a day and floss at least once a day. Follow up with your dentist regularly.  · Schedule an eye exam during the first trimester of your pregnancy or as directed by your health care provider.  · Share your diabetes management plan with your workplace or school.  · Stay up-to-date with immunizations.  · Learn to manage stress.  · Obtain ongoing diabetes education and support as needed.  · Learn about and consider breastfeeding your baby.  · You should have your blood sugar level checked 6-12 weeks after delivery. This is done with an oral glucose tolerance test (OGTT).  SEEK MEDICAL CARE IF:   · You are unable to  eat food or drink fluids for more than 6 hours.  · You have nausea and vomiting for more than 6 hours.  · You have a blood glucose level of 200 mg/dL and you have ketones in your urine.  · There is a change in mental status.  · You develop vision problems.  · You have a persistent headache.  · You have upper abdominal pain or discomfort.  · You develop an additional serious illness.  · You have diarrhea for more than 6 hours.  · You have been sick or have had a fever for a couple of days and are not getting better.  SEEK IMMEDIATE MEDICAL CARE IF:   · You have difficulty breathing.  · You no longer feel the baby moving.  · You are bleeding or have discharge from your vagina.  · You start having premature contractions or labor.  MAKE SURE YOU:  · Understand these instructions.  · Will watch your condition.  · Will get help right away if you are not doing well or get worse.     This information is not intended to replace advice given to you by your health care provider. Make sure you discuss any questions you have with your health care provider.     Document Released: 08/02/2000 Document Revised: 05/17/2014 Document Reviewed: 11/23/2011  Elsevier Interactive Patient Education ©2016 [employer]

## 2015-12-12 ENCOUNTER — Encounter: Payer: Self-pay | Admitting: *Deleted

## 2015-12-12 LAB — GLUCOSE TOLERANCE, 3 HOURS
GLUCOSE, 1 HOUR-GESTATIONAL: 193 mg/dL — AB (ref <190)
Glucose Tolerance, 2 hour: 227 mg/dL — ABNORMAL HIGH (ref <165)
Glucose Tolerance, Fasting: 96 mg/dL (ref 65–104)
Glucose, GTT - 3 Hour: 197 mg/dL — ABNORMAL HIGH (ref <145)

## 2015-12-12 NOTE — Telephone Encounter (Signed)
Patient was seen in our office yesterday.  

## 2015-12-16 ENCOUNTER — Telehealth: Payer: Self-pay | Admitting: *Deleted

## 2015-12-16 MED ORDER — BLOOD GLUCOSE MONITOR KIT: PACK | 0 refills | Status: DC

## 2015-12-16 MED ORDER — BLOOD GLUCOSE METER KIT: PACK | 0 refills | Status: DC

## 2015-12-16 NOTE — Telephone Encounter (Signed)
Called pt and informed her of abnormal 3hr GTT and need to begin checking her blood sugar. Pt stated that she does remember how to check her blood sugar however does not have a meter. She did not go on vacation as previously planned and can receive her supplies from her usual pharmacy. Prescriptions sent for meter and all necessary supplies. She was instructed to stop eating refined sugary foods and to check her blood sugar daily before eating breakfast and 2hrs after each meal. She will need appt on 8/14 to see the doctor and meet w/Diabetes educator. She should bring these blood sugar results to the appt. Pt voiced understanding of all information and instructions given.

## 2015-12-22 ENCOUNTER — Ambulatory Visit (HOSPITAL_COMMUNITY)
Admission: RE | Admit: 2015-12-22 | Discharge: 2015-12-22 | Disposition: A | Discharge status: Home / Self Care | Payer: BC Managed Care – PPO | Source: Ambulatory Visit | Attending: Obstetrics & Gynecology | Admitting: Obstetrics & Gynecology

## 2015-12-22 ENCOUNTER — Encounter: Payer: BC Managed Care – PPO | Attending: Obstetrics & Gynecology | Admitting: Dietician

## 2015-12-22 ENCOUNTER — Encounter (HOSPITAL_COMMUNITY): Payer: Self-pay

## 2015-12-22 ENCOUNTER — Ambulatory Visit: Payer: BC Managed Care – PPO | Admitting: *Deleted

## 2015-12-22 ENCOUNTER — Ambulatory Visit (INDEPENDENT_AMBULATORY_CARE_PROVIDER_SITE_OTHER): Payer: BC Managed Care – PPO | Admitting: Family Medicine

## 2015-12-22 VITALS — BP 118/74 | HR 84 | Wt 251.0 lb

## 2015-12-22 DIAGNOSIS — O10013 Pre-existing essential hypertension complicating pregnancy, third trimester: Secondary | ICD-10-CM | POA: Insufficient documentation

## 2015-12-22 DIAGNOSIS — Z713 Dietary counseling and surveillance: Secondary | ICD-10-CM | POA: Diagnosis not present

## 2015-12-22 DIAGNOSIS — O10919 Unspecified pre-existing hypertension complicating pregnancy, unspecified trimester: Secondary | ICD-10-CM

## 2015-12-22 DIAGNOSIS — O10912 Unspecified pre-existing hypertension complicating pregnancy, second trimester: Secondary | ICD-10-CM | POA: Diagnosis not present

## 2015-12-22 DIAGNOSIS — O09523 Supervision of elderly multigravida, third trimester: Secondary | ICD-10-CM

## 2015-12-22 DIAGNOSIS — O24419 Gestational diabetes mellitus in pregnancy, unspecified control: Secondary | ICD-10-CM

## 2015-12-22 DIAGNOSIS — O10913 Unspecified pre-existing hypertension complicating pregnancy, third trimester: Secondary | ICD-10-CM

## 2015-12-22 DIAGNOSIS — O99323 Drug use complicating pregnancy, third trimester: Secondary | ICD-10-CM | POA: Diagnosis not present

## 2015-12-22 DIAGNOSIS — O24919 Unspecified diabetes mellitus in pregnancy, unspecified trimester: Secondary | ICD-10-CM

## 2015-12-22 DIAGNOSIS — O99213 Obesity complicating pregnancy, third trimester: Secondary | ICD-10-CM | POA: Insufficient documentation

## 2015-12-22 DIAGNOSIS — O0993 Supervision of high risk pregnancy, unspecified, third trimester: Secondary | ICD-10-CM

## 2015-12-22 HISTORY — DX: Gestational diabetes mellitus in pregnancy, unspecified control: O24.419

## 2015-12-22 LAB — POCT URINALYSIS DIP (DEVICE)
Bilirubin Urine: NEGATIVE
Glucose, UA: NEGATIVE mg/dL
HGB URINE DIPSTICK: NEGATIVE
Ketones, ur: 80 mg/dL — AB
Leukocytes, UA: NEGATIVE
Nitrite: NEGATIVE
PROTEIN: NEGATIVE mg/dL
SPECIFIC GRAVITY, URINE: 1.015 (ref 1.005–1.030)
UROBILINOGEN UA: 0.2 mg/dL (ref 0.0–1.0)
pH: 6 (ref 5.0–8.0)

## 2015-12-22 MED ORDER — ZOLPIDEM TARTRATE 10 MG PO TABS: ORAL_TABLET | ORAL | 0 refills | Status: DC

## 2015-12-22 MED ORDER — GLYBURIDE 5 MG PO TABS: 5.0000 mg | ORAL_TABLET | Freq: Two times a day (BID) | ORAL | 3 refills | Status: DC

## 2015-12-22 NOTE — Progress Notes (Signed)
Diabetes Education:12/22/15 Sherry BrightShaanita is a 35 y/o G5P3.  Has a + family HX (Mom and Ephriam JenkinsDas) for type 2 DM.  Has a [employer] Plus meter and has started to check blood glucose levels.  Had hx of GDM with last pregnancy.  Some recall of dietary recommendations.  Currently, "walking some." Fasting levels are 100-120-13 mg/dl.   Post-meal levels are [phone] mg/dl. Completed a review of GDM and the implications for the mother and baby. Review of the self-care measures post-delivery for the prevention of Type 2 DM. Review of the factors that influence blood glucose. Recommended walking 30 minutes daily in the cooler part of day.  [facility] and has walked the indoor track in the past.  Encouraged to continue to do this. Review of blood glucose monitoring process. Review of the fasting and 2 hr post meal glucose goals. Instructed to monitor fasting (60-90 mg/dl) levels and 2 hr post-meal (120 mg or <) glucose levels; record levels and bring glucose log to all clinic appointments. Review of nutritional needs during pregnancy and the influence of the different food groups on blood glucose levels during pregnancy. Review of CHO foods, portions, carb counting and meal planning. Provided handout "Nutrition, Diabetes and Pregnancy", and a yellow carb counting reference card. Maggie Roseanne Juenger, RN, RD LDN

## 2015-12-22 NOTE — Progress Notes (Signed)
Subjective:  Sherry QuinonesShanita S Stockton-Saleh is a 35 y.o. [id number] at 6180w4d being seen today for ongoing prenatal care.  She is currently monitored for the following issues for this high-risk pregnancy and has Obesity; Essential hypertension, benign; Mixed anxiety and depressive disorder; Fibromyalgia; Supervision of high risk pregnancy, antepartum; Chronic hypertension during pregnancy, antepartum; Advanced maternal age in multigravida; Obesity affecting pregnancy, antepartum; BP (high blood pressure); and Cannabis abuse on her problem list.  GDM: Patient taking no medications.  Reports no hypoglycemic episodes.  Tolerating medication well Fasting: 106-236 (236 is abbarent, most 106-147 2hr PP: 105-271  Patient reports no complaints.  Contractions: Irritability. Vag. Bleeding: None.  Movement: Present. Denies leaking of fluid.   The following portions of the patient's history were reviewed and updated as appropriate: allergies, current medications, past family history, past medical history, past social history, past surgical history and problem list. Problem list updated.  Objective:   Vitals:   12/22/15 1321  BP: 118/74  Pulse: 84  Weight: 251 lb (113.9 kg)    Fetal Status: Fetal Heart Rate (bpm): 142   Movement: Present     General:  Alert, oriented and cooperative. Patient is in no acute distress.  Skin: Skin is warm and dry. No rash noted.   Cardiovascular: Normal heart rate noted  Respiratory: Normal respiratory effort, no problems with respiration noted  Abdomen: Soft, gravid, appropriate for gestational age. Pain/Pressure: Present     Pelvic: Vag. Bleeding: None Vag D/C Character: White   Cervical exam deferred        Extremities: Normal range of motion.  Edema: Trace  Mental Status: Normal mood and affect. Normal behavior. Normal judgment and thought content.   Urinalysis:      Assessment and Plan:  Pregnancy: [id number] at 4080w4d  1. Supervision of high risk pregnancy, antepartum,  third trimester FHT/FH normal  2. Chronic hypertension during pregnancy, antepartum BP controlled.  3. Advanced maternal age in multigravida, third trimester  4. Gestational diabetes mellitus (GDM) affecting pregnancy, antepartum (HCC) Start glyburide.  NST twice weekly.  NST today - reactive.  Will need induction at 39 weeks.   Preterm labor symptoms and general obstetric precautions including but not limited to vaginal bleeding, contractions, leaking of fluid and fetal movement were reviewed in detail with the patient. Please refer to After Visit Summary for other counseling recommendations.  No Follow-up on file.   Levie HeritageJacob J Orell Hurtado, DO

## 2015-12-23 ENCOUNTER — Other Ambulatory Visit (HOSPITAL_COMMUNITY): Payer: Self-pay | Admitting: *Deleted

## 2015-12-23 DIAGNOSIS — O24419 Gestational diabetes mellitus in pregnancy, unspecified control: Secondary | ICD-10-CM

## 2015-12-25 ENCOUNTER — Ambulatory Visit (INDEPENDENT_AMBULATORY_CARE_PROVIDER_SITE_OTHER): Payer: BC Managed Care – PPO | Admitting: *Deleted

## 2015-12-25 ENCOUNTER — Other Ambulatory Visit: Payer: Self-pay | Admitting: *Deleted

## 2015-12-25 ENCOUNTER — Ambulatory Visit (HOSPITAL_COMMUNITY)
Admission: RE | Admit: 2015-12-25 | Discharge: 2015-12-25 | Disposition: A | Discharge status: Home / Self Care | Payer: BC Managed Care – PPO | Source: Ambulatory Visit | Attending: Obstetrics and Gynecology | Admitting: Obstetrics and Gynecology

## 2015-12-25 VITALS — BP 114/75 | HR 88 | Wt 249.7 lb

## 2015-12-25 DIAGNOSIS — O10013 Pre-existing essential hypertension complicating pregnancy, third trimester: Secondary | ICD-10-CM | POA: Diagnosis not present

## 2015-12-25 DIAGNOSIS — Z3A33 33 weeks gestation of pregnancy: Secondary | ICD-10-CM | POA: Insufficient documentation

## 2015-12-25 DIAGNOSIS — O24419 Gestational diabetes mellitus in pregnancy, unspecified control: Secondary | ICD-10-CM | POA: Diagnosis not present

## 2015-12-25 DIAGNOSIS — O99213 Obesity complicating pregnancy, third trimester: Secondary | ICD-10-CM | POA: Diagnosis not present

## 2015-12-25 DIAGNOSIS — O10919 Unspecified pre-existing hypertension complicating pregnancy, unspecified trimester: Secondary | ICD-10-CM

## 2015-12-25 DIAGNOSIS — O10912 Unspecified pre-existing hypertension complicating pregnancy, second trimester: Secondary | ICD-10-CM

## 2015-12-25 DIAGNOSIS — O09523 Supervision of elderly multigravida, third trimester: Secondary | ICD-10-CM | POA: Insufficient documentation

## 2015-12-25 DIAGNOSIS — O99323 Drug use complicating pregnancy, third trimester: Secondary | ICD-10-CM | POA: Insufficient documentation

## 2015-12-25 NOTE — Progress Notes (Unsigned)
.  mf

## 2015-12-28 ENCOUNTER — Other Ambulatory Visit: Payer: Self-pay | Admitting: Obstetrics and Gynecology

## 2015-12-29 ENCOUNTER — Other Ambulatory Visit: Payer: BC Managed Care – PPO | Admitting: Obstetrics and Gynecology

## 2015-12-29 ENCOUNTER — Ambulatory Visit (INDEPENDENT_AMBULATORY_CARE_PROVIDER_SITE_OTHER): Payer: BC Managed Care – PPO | Admitting: Obstetrics & Gynecology

## 2015-12-29 VITALS — BP 123/72 | HR 90 | Wt 250.9 lb

## 2015-12-29 DIAGNOSIS — O99213 Obesity complicating pregnancy, third trimester: Secondary | ICD-10-CM

## 2015-12-29 DIAGNOSIS — O10912 Unspecified pre-existing hypertension complicating pregnancy, second trimester: Secondary | ICD-10-CM

## 2015-12-29 DIAGNOSIS — O24419 Gestational diabetes mellitus in pregnancy, unspecified control: Secondary | ICD-10-CM

## 2015-12-29 DIAGNOSIS — O24415 Gestational diabetes mellitus in pregnancy, controlled by oral hypoglycemic drugs: Secondary | ICD-10-CM

## 2015-12-29 DIAGNOSIS — O10919 Unspecified pre-existing hypertension complicating pregnancy, unspecified trimester: Secondary | ICD-10-CM

## 2015-12-29 DIAGNOSIS — E669 Obesity, unspecified: Secondary | ICD-10-CM

## 2015-12-29 DIAGNOSIS — O24919 Unspecified diabetes mellitus in pregnancy, unspecified trimester: Secondary | ICD-10-CM

## 2015-12-29 DIAGNOSIS — O10913 Unspecified pre-existing hypertension complicating pregnancy, third trimester: Secondary | ICD-10-CM

## 2015-12-29 DIAGNOSIS — F418 Other specified anxiety disorders: Secondary | ICD-10-CM

## 2015-12-29 DIAGNOSIS — M797 Fibromyalgia: Secondary | ICD-10-CM

## 2015-12-29 DIAGNOSIS — I10 Essential (primary) hypertension: Secondary | ICD-10-CM | POA: Diagnosis not present

## 2015-12-29 DIAGNOSIS — O0993 Supervision of high risk pregnancy, unspecified, third trimester: Secondary | ICD-10-CM

## 2015-12-29 DIAGNOSIS — F1210 Cannabis abuse, uncomplicated: Secondary | ICD-10-CM

## 2015-12-29 LAB — POCT URINALYSIS DIP (DEVICE)
Bilirubin Urine: NEGATIVE
GLUCOSE, UA: NEGATIVE mg/dL
Hgb urine dipstick: NEGATIVE
KETONES UR: NEGATIVE mg/dL
Leukocytes, UA: NEGATIVE
NITRITE: NEGATIVE
Protein, ur: NEGATIVE mg/dL
SPECIFIC GRAVITY, URINE: 1.015 (ref 1.005–1.030)
UROBILINOGEN UA: 0.2 mg/dL (ref 0.0–1.0)
pH: 6 (ref 5.0–8.0)

## 2015-12-29 MED ORDER — GLUCOSE BLOOD VI STRP: ORAL_STRIP | 12 refills | Status: DC

## 2015-12-29 MED ORDER — ONETOUCH ULTRASOFT LANCETS MISC: 12 refills | Status: DC

## 2015-12-29 NOTE — Patient Instructions (Signed)
Gestational Diabetes Mellitus  Gestational diabetes mellitus, often simply referred to as gestational diabetes, is a type of diabetes that some women develop during pregnancy. In gestational diabetes, the pancreas does not make enough insulin (a hormone), the cells are less responsive to the insulin that is made (insulin resistance), or both. Normally, insulin moves sugars from food into the tissue cells. The tissue cells use the sugars for energy. The lack of insulin or the lack of normal response to insulin causes excess sugars to build up in the blood instead of going into the tissue cells. As a result, high blood sugar (hyperglycemia) develops. The effect of high sugar (glucose) levels can cause many problems.   RISK FACTORS  You have an increased chance of developing gestational diabetes if you have a family history of diabetes and also have one or more of the following risk factors:  · A body mass index over 30 (obesity).  · A previous pregnancy with gestational diabetes.  · An older age at the time of pregnancy.  If blood glucose levels are kept in the normal range during pregnancy, women can have a healthy pregnancy. If your blood glucose levels are not well controlled, there may be risks to you, your unborn baby (fetus), your labor and delivery, or your newborn baby.   SYMPTOMS   If symptoms are experienced, they are much like symptoms you would normally expect during pregnancy. The symptoms of gestational diabetes include:   · Increased thirst (polydipsia).  · Increased urination (polyuria).  · Increased urination during the night (nocturia).  · Weight loss. This weight loss may be rapid.  · Frequent, recurring infections.  · Tiredness (fatigue).  · Weakness.  · Vision changes, such as blurred vision.  · Fruity smell to your breath.  · Abdominal pain.  DIAGNOSIS  Diabetes is diagnosed when blood glucose levels are increased. Your blood glucose level may be checked by one or more of the following blood  tests:  · A fasting blood glucose test. You will not be allowed to eat for at least 8 hours before a blood sample is taken.  · A random blood glucose test. Your blood glucose is checked at any time of the day regardless of when you ate.  · An oral glucose tolerance test (OGTT). Your blood glucose is measured after you have not eaten (fasted) for 1-3 hours and then after you drink a glucose-containing beverage. Since the hormones that cause insulin resistance are highest at about 24-28 weeks of a pregnancy, an OGTT is usually performed during that time. If you have risk factors, you may be screened for undiagnosed type 2 diabetes at your first prenatal visit.  TREATMENT   Gestational diabetes should be managed first with diet and exercise. Medicines may be added only if they are needed.  · You will need to take diabetes medicine or insulin daily to keep blood glucose levels in the desired range.  · You will need to match insulin dosing with exercise and healthy food choices.  If you have gestational diabetes, your treatment goal is to maintain the following blood glucose levels:  · Before meals (preprandial): at or below 95 mg/dL.  · After meals (postprandial):    One hour after a meal: at or below 140 mg/dL.    Two hours after a meal: at or below 120 mg/dL.  If you have pre-existing type 1 or type 2 diabetes, your treatment goal is to maintain the following blood glucose levels:  · Before   meals, at bedtime, and overnight: 60-99 mg/dL.  · After meals: peak of 100-129 mg/dL.  HOME CARE INSTRUCTIONS   · Have your hemoglobin A1c level checked twice a year.  · Perform daily blood glucose monitoring as directed by your health care provider. It is common to perform frequent blood glucose monitoring.  · Monitor urine ketones when you are ill and as directed by your health care provider.  · Take your diabetes medicine and insulin as directed by your health care provider to maintain your blood glucose level in the desired  range.  ¨ Never run out of diabetes medicine or insulin. It is needed every day.  ¨ Adjust insulin based on your intake of carbohydrates. Carbohydrates can raise blood glucose levels but need to be included in your diet. Carbohydrates provide vitamins, minerals, and fiber which are an essential part of a healthy diet. Carbohydrates are found in fruits, vegetables, whole grains, dairy products, legumes, and foods containing added sugars.  · Eat healthy foods. Alternate 3 meals with 3 snacks.  · Maintain a healthy weight gain. The usual total expected weight gain varies according to your prepregnancy body mass index (BMI).  · Carry a medical alert card or wear your medical alert jewelry.  · Carry a 15-gram carbohydrate snack with you at all times to treat low blood glucose (hypoglycemia). Some examples of 15-gram carbohydrate snacks include:  ¨ Glucose tablets, 3 or 4.  ¨ Glucose gel, 15-gram tube.  ¨ Raisins, 2 tablespoons (24 g).  ¨ Jelly beans, 6.  ¨ Animal crackers, 8.  ¨ Fruit juice, regular soda, or low-fat milk, 4 ounces (120 mL).  ¨ Gummy treats, 9.  · Recognize hypoglycemia. Hypoglycemia during pregnancy occurs with blood glucose levels of 60 mg/dL and below. The risk for hypoglycemia increases when fasting or skipping meals, during or after intense exercise, and during sleep. Hypoglycemia symptoms can include:  ¨ Tremors or shakes.  ¨ Decreased ability to concentrate.  ¨ Sweating.  ¨ Increased heart rate.  ¨ Headache.  ¨ Dry mouth.  ¨ Hunger.  ¨ Irritability.  ¨ Anxiety.  ¨ Restless sleep.  ¨ Altered speech or coordination.  ¨ Confusion.  · Treat hypoglycemia promptly. If you are alert and able to safely swallow, follow the 15:15 rule:  ¨ Take 15-20 grams of rapid-acting glucose or carbohydrate. Rapid-acting options include glucose gel, glucose tablets, or 4 ounces (120 mL) of fruit juice, regular soda, or low-fat milk.  ¨ Check your blood glucose level 15 minutes after taking the glucose.  ¨ Take 15-20  grams more of glucose if the repeat blood glucose level is still 70 mg/dL or below.  ¨ Eat a meal or snack within 1 hour once blood glucose levels return to normal.  · Be alert to polyuria (excess urination) and polydipsia (excess thirst) which are early signs of hyperglycemia. An early awareness of hyperglycemia allows for prompt treatment. Treat hyperglycemia as directed by your health care provider.  · Engage in at least 30 minutes of physical activity a day or as directed by your health care provider. Ten minutes of physical activity timed 30 minutes after each meal is encouraged to control postprandial blood glucose levels.  · Adjust your insulin dosing and food intake as needed if you start a new exercise or sport.  · Follow your sick-day plan at any time you are unable to eat or drink as usual.  · Avoid tobacco and alcohol use.  · Keep all follow-up visits as directed   by your health care provider.  · Follow the advice of your health care provider regarding your prenatal and post-delivery (postpartum) appointments, meal planning, exercise, medicines, vitamins, blood tests, other medical tests, and physical activities.  · Perform daily skin and foot care. Examine your skin and feet daily for cuts, bruises, redness, nail problems, bleeding, blisters, or sores.  · Brush your teeth and gums at least twice a day and floss at least once a day. Follow up with your dentist regularly.  · Schedule an eye exam during the first trimester of your pregnancy or as directed by your health care provider.  · Share your diabetes management plan with your workplace or school.  · Stay up-to-date with immunizations.  · Learn to manage stress.  · Obtain ongoing diabetes education and support as needed.  · Learn about and consider breastfeeding your baby.  · You should have your blood sugar level checked 6-12 weeks after delivery. This is done with an oral glucose tolerance test (OGTT).  SEEK MEDICAL CARE IF:   · You are unable to  eat food or drink fluids for more than 6 hours.  · You have nausea and vomiting for more than 6 hours.  · You have a blood glucose level of 200 mg/dL and you have ketones in your urine.  · There is a change in mental status.  · You develop vision problems.  · You have a persistent headache.  · You have upper abdominal pain or discomfort.  · You develop an additional serious illness.  · You have diarrhea for more than 6 hours.  · You have been sick or have had a fever for a couple of days and are not getting better.  SEEK IMMEDIATE MEDICAL CARE IF:   · You have difficulty breathing.  · You no longer feel the baby moving.  · You are bleeding or have discharge from your vagina.  · You start having premature contractions or labor.  MAKE SURE YOU:  · Understand these instructions.  · Will watch your condition.  · Will get help right away if you are not doing well or get worse.     This information is not intended to replace advice given to you by your health care provider. Make sure you discuss any questions you have with your health care provider.     Document Released: 08/02/2000 Document Revised: 05/17/2014 Document Reviewed: 11/23/2011  Elsevier Interactive Patient Education ©2016 [employer]

## 2015-12-29 NOTE — Progress Notes (Signed)
Subjective:  Sherry Jimenez is a 35 y.o. [id number] at 6439w4d being seen today for ongoing prenatal care.  She is currently monitored for the following issues for this high-risk pregnancy and has Obesity; Essential hypertension, benign; Mixed anxiety and depressive disorder; Fibromyalgia; Supervision of high risk pregnancy, antepartum; Chronic hypertension during pregnancy, antepartum; Advanced maternal age in multigravida; Obesity affecting pregnancy, antepartum; BP (high blood pressure); Cannabis abuse; and Gestational diabetes mellitus (GDM) affecting pregnancy, antepartum (HCC) on her problem list.  Patient reports no complaints.  Contractions: Not present. Vag. Bleeding: None.  Movement: Present. Denies leaking of fluid.   The following portions of the patient's history were reviewed and updated as appropriate: allergies, current medications, past family history, past medical history, past social history, past surgical history and problem list. Problem list updated.  Objective:   Vitals:   12/29/15 1456  BP: 123/72  Pulse: 90  Weight: 250 lb 14.4 oz (113.8 kg)    Fetal Status: Fetal Heart Rate (bpm): NST Fundal Height: 38 cm Movement: Present     General:  Alert, oriented and cooperative. Patient is in no acute distress.  Skin: Skin is warm and dry. No rash noted.   Cardiovascular: Normal heart rate noted  Respiratory: Normal respiratory effort, no problems with respiration noted  Abdomen: Soft, gravid, appropriate for gestational age. Pain/Pressure: Absent     Pelvic:  Cervical exam deferred        Extremities: Normal range of motion.  Edema: None  Mental Status: Normal mood and affect. Normal behavior. Normal judgment and thought content.   Urinalysis:      Assessment and Plan:  Pregnancy: [id number] at 1239w4d  1. Chronic hypertension complicating or reason for care during pregnancy, third trimester  - Fetal nonstress test NST reviewed and reactive.  2. Gestational  diabetes mellitus (GDM) in third trimester controlled on oral hypoglycemic drug  - Fetal nonstress test Glucose log reviewed and only occ abnormal results to the 170's.  Reviewed diet  3. Supervision of high risk pregnancy, antepartum, third trimester   4. Obesity affecting pregnancy, antepartum, third trimester   5. Obesity   6. Mixed anxiety and depressive disorder Kids going to school. Dealing with changes ok     Preterm labor symptoms and general obstetric precautions including but not limited to vaginal bleeding, contractions, leaking of fluid and fetal movement were reviewed in detail with the patient. Please refer to After Visit Summary for other counseling recommendations.  Return in about 3 days (around 01/01/2016) for NST/AFI.   Willodean Rosenthalarolyn Harraway-Smith, MD

## 2015-12-29 NOTE — Progress Notes (Signed)
US for growth scheduled 9/21.  Pt requests Rx for glucose strips and lancets - sent to pharmacy. .Marland Kitchen

## 2016-01-01 ENCOUNTER — Ambulatory Visit (INDEPENDENT_AMBULATORY_CARE_PROVIDER_SITE_OTHER): Payer: BC Managed Care – PPO | Admitting: Obstetrics and Gynecology

## 2016-01-01 VITALS — BP 95/67 | HR 98

## 2016-01-01 DIAGNOSIS — O10913 Unspecified pre-existing hypertension complicating pregnancy, third trimester: Secondary | ICD-10-CM | POA: Diagnosis not present

## 2016-01-01 DIAGNOSIS — Z36 Encounter for antenatal screening of mother: Secondary | ICD-10-CM | POA: Diagnosis not present

## 2016-01-01 DIAGNOSIS — O24415 Gestational diabetes mellitus in pregnancy, controlled by oral hypoglycemic drugs: Secondary | ICD-10-CM

## 2016-01-01 DIAGNOSIS — I10 Essential (primary) hypertension: Secondary | ICD-10-CM | POA: Diagnosis not present

## 2016-01-01 NOTE — Progress Notes (Signed)
NST reviewed and reactive.  

## 2016-01-05 ENCOUNTER — Ambulatory Visit (INDEPENDENT_AMBULATORY_CARE_PROVIDER_SITE_OTHER): Payer: BC Managed Care – PPO | Admitting: *Deleted

## 2016-01-05 VITALS — BP 118/69 | HR 102

## 2016-01-05 DIAGNOSIS — I10 Essential (primary) hypertension: Secondary | ICD-10-CM | POA: Diagnosis not present

## 2016-01-05 DIAGNOSIS — O10913 Unspecified pre-existing hypertension complicating pregnancy, third trimester: Secondary | ICD-10-CM | POA: Diagnosis not present

## 2016-01-05 NOTE — Progress Notes (Signed)
NST reactive.

## 2016-01-05 NOTE — Progress Notes (Signed)
Reactive NST. Continue with fetal testing and routine OB care 

## 2016-01-08 ENCOUNTER — Ambulatory Visit (INDEPENDENT_AMBULATORY_CARE_PROVIDER_SITE_OTHER): Payer: BC Managed Care – PPO | Admitting: *Deleted

## 2016-01-08 VITALS — BP 107/71 | HR 101

## 2016-01-08 DIAGNOSIS — I10 Essential (primary) hypertension: Secondary | ICD-10-CM

## 2016-01-08 DIAGNOSIS — Z36 Encounter for antenatal screening of mother: Secondary | ICD-10-CM | POA: Diagnosis not present

## 2016-01-08 DIAGNOSIS — O10913 Unspecified pre-existing hypertension complicating pregnancy, third trimester: Secondary | ICD-10-CM

## 2016-01-08 DIAGNOSIS — O24415 Gestational diabetes mellitus in pregnancy, controlled by oral hypoglycemic drugs: Secondary | ICD-10-CM

## 2016-01-13 ENCOUNTER — Ambulatory Visit (INDEPENDENT_AMBULATORY_CARE_PROVIDER_SITE_OTHER): Payer: BC Managed Care – PPO | Admitting: Advanced Practice Midwife

## 2016-01-13 VITALS — BP 119/73 | HR 97 | Wt 251.3 lb

## 2016-01-13 DIAGNOSIS — O99891 Other specified diseases and conditions complicating pregnancy: Secondary | ICD-10-CM

## 2016-01-13 DIAGNOSIS — Z9109 Other allergy status, other than to drugs and biological substances: Secondary | ICD-10-CM

## 2016-01-13 DIAGNOSIS — Z91048 Other nonmedicinal substance allergy status: Secondary | ICD-10-CM

## 2016-01-13 DIAGNOSIS — O0993 Supervision of high risk pregnancy, unspecified, third trimester: Secondary | ICD-10-CM

## 2016-01-13 DIAGNOSIS — O24415 Gestational diabetes mellitus in pregnancy, controlled by oral hypoglycemic drugs: Secondary | ICD-10-CM

## 2016-01-13 DIAGNOSIS — O10913 Unspecified pre-existing hypertension complicating pregnancy, third trimester: Secondary | ICD-10-CM | POA: Diagnosis not present

## 2016-01-13 DIAGNOSIS — O26893 Other specified pregnancy related conditions, third trimester: Secondary | ICD-10-CM

## 2016-01-13 DIAGNOSIS — O9989 Other specified diseases and conditions complicating pregnancy, childbirth and the puerperium: Secondary | ICD-10-CM

## 2016-01-13 DIAGNOSIS — M549 Dorsalgia, unspecified: Secondary | ICD-10-CM

## 2016-01-13 DIAGNOSIS — O10919 Unspecified pre-existing hypertension complicating pregnancy, unspecified trimester: Secondary | ICD-10-CM

## 2016-01-13 DIAGNOSIS — I10 Essential (primary) hypertension: Secondary | ICD-10-CM

## 2016-01-13 LAB — POCT URINALYSIS DIP (DEVICE)
BILIRUBIN URINE: NEGATIVE
Glucose, UA: NEGATIVE mg/dL
HGB URINE DIPSTICK: NEGATIVE
Ketones, ur: 40 mg/dL — AB
LEUKOCYTES UA: NEGATIVE
NITRITE: NEGATIVE
Protein, ur: NEGATIVE mg/dL
Specific Gravity, Urine: 1.015 (ref 1.005–1.030)
Urobilinogen, UA: 0.2 mg/dL (ref 0.0–1.0)
pH: 6 (ref 5.0–8.0)

## 2016-01-13 LAB — OB RESULTS CONSOLE GC/CHLAMYDIA: GC PROBE AMP, GENITAL: NEGATIVE

## 2016-01-13 LAB — OB RESULTS CONSOLE GBS: GBS: NEGATIVE

## 2016-01-13 MED ORDER — ZOLPIDEM TARTRATE 10 MG PO TABS: ORAL_TABLET | ORAL | 0 refills | Status: DC

## 2016-01-13 NOTE — Progress Notes (Signed)
Pt reports increased pelvic pressure and pain.

## 2016-01-13 NOTE — Progress Notes (Signed)
   PRENATAL VISIT NOTE  Subjective:  Sherry Jimenez is a 35 y.o. [id number] at 2867w5d being seen today for ongoing prenatal care.  She is currently monitored for the following issues for this high-risk pregnancy and has Obesity; Essential hypertension, benign; Mixed anxiety and depressive disorder; Fibromyalgia; Supervision of high risk pregnancy, antepartum; Chronic hypertension during pregnancy, antepartum; Advanced maternal age in multigravida; Obesity affecting pregnancy, antepartum; Cannabis abuse; and Gestational diabetes mellitus (GDM) affecting pregnancy, antepartum (HCC) on her problem list.  Patient reports Pelvic pressure since last week, back pain daily, sciatic.  Contractions: Irregular. Vag. Bleeding: None.  Movement: Present. Denies leaking of fluid.   The following portions of the patient's history were reviewed and updated as appropriate: allergies, current medications, past family history, past medical history, past social history, past surgical history and problem list. Problem list updated.  Objective:   Vitals:   01/13/16 1546  BP: 119/73  Pulse: 97  Weight: 251 lb 4.8 oz (114 kg)    Fetal Status:     Movement: Present     General:  Alert, oriented and cooperative. Patient is in no acute distress.  Skin: Skin is warm and dry. No rash noted.   Cardiovascular: Normal heart rate noted  Respiratory: Normal respiratory effort, no problems with respiration noted  Abdomen: Soft, gravid, appropriate for gestational age. Pain/Pressure: Present     Pelvic:  Cervical exam performed        Extremities: Normal range of motion.  Edema: None  Mental Status: Normal mood and affect. Normal behavior. Normal judgment and thought content.  Cervix FT/30/-3/vertex  Urinalysis:      Assessment and Plan:  Pregnancy: [id number] at 167w5d  1. Chronic hypertension complicating or reason for care during pregnancy, third trimester    BP normal today - Fetal nonstress test  2.  Gestational diabetes mellitus (GDM) in third trimester controlled on oral hypoglycemic drug      Forgot book,  FBS's all under 100                             PC's all under 120 (around 110), sometimes goes low if she forgets to eat, 40-50s, better with eating.  - Fetal nonstress test  3. Supervision of high risk pregnancy, antepartum, third trimester  - GC/Chlamydia probe amp ([place])[facility] - Culture, Grp B Strep w/Rflx Suscept  4. Chronic hypertension during pregnancy, antepartum        Preterm labor symptoms and general obstetric precautions including but not limited to vaginal bleeding, contractions, leaking of fluid and fetal movement were reviewed in detail with the patient. Please refer to After Visit Summary for other counseling recommendations.  Return in about 1 week (around 01/20/2016) for 2x/wk as scheduled, [facility].  Aviva SignsMarie L Maire Govan, CNM

## 2016-01-13 NOTE — Patient Instructions (Signed)

## 2016-01-14 LAB — GC/CHLAMYDIA PROBE AMP (~~LOC~~) NOT AT ARMC
Chlamydia: NEGATIVE
Neisseria Gonorrhea: NEGATIVE

## 2016-01-15 ENCOUNTER — Ambulatory Visit (INDEPENDENT_AMBULATORY_CARE_PROVIDER_SITE_OTHER): Payer: BC Managed Care – PPO | Admitting: *Deleted

## 2016-01-15 VITALS — BP 114/75 | HR 90

## 2016-01-15 DIAGNOSIS — O24415 Gestational diabetes mellitus in pregnancy, controlled by oral hypoglycemic drugs: Secondary | ICD-10-CM

## 2016-01-15 DIAGNOSIS — I10 Essential (primary) hypertension: Secondary | ICD-10-CM | POA: Diagnosis not present

## 2016-01-15 DIAGNOSIS — O10913 Unspecified pre-existing hypertension complicating pregnancy, third trimester: Secondary | ICD-10-CM

## 2016-01-15 DIAGNOSIS — Z36 Encounter for antenatal screening of mother: Secondary | ICD-10-CM | POA: Diagnosis not present

## 2016-01-16 LAB — CULTURE, STREPTOCOCCUS GRP B W/SUSCEPT

## 2016-01-19 ENCOUNTER — Ambulatory Visit (INDEPENDENT_AMBULATORY_CARE_PROVIDER_SITE_OTHER): Payer: BC Managed Care – PPO | Admitting: Obstetrics & Gynecology

## 2016-01-19 VITALS — BP 117/78 | HR 98 | Wt 253.4 lb

## 2016-01-19 DIAGNOSIS — O10919 Unspecified pre-existing hypertension complicating pregnancy, unspecified trimester: Secondary | ICD-10-CM

## 2016-01-19 DIAGNOSIS — O10912 Unspecified pre-existing hypertension complicating pregnancy, second trimester: Secondary | ICD-10-CM | POA: Diagnosis not present

## 2016-01-19 DIAGNOSIS — Z113 Encounter for screening for infections with a predominantly sexual mode of transmission: Secondary | ICD-10-CM | POA: Diagnosis not present

## 2016-01-19 DIAGNOSIS — O24919 Unspecified diabetes mellitus in pregnancy, unspecified trimester: Secondary | ICD-10-CM

## 2016-01-19 DIAGNOSIS — O24419 Gestational diabetes mellitus in pregnancy, unspecified control: Secondary | ICD-10-CM

## 2016-01-19 DIAGNOSIS — N898 Other specified noninflammatory disorders of vagina: Secondary | ICD-10-CM

## 2016-01-19 DIAGNOSIS — O9989 Other specified diseases and conditions complicating pregnancy, childbirth and the puerperium: Secondary | ICD-10-CM

## 2016-01-19 LAB — POCT URINALYSIS DIP (DEVICE)
BILIRUBIN URINE: NEGATIVE
Glucose, UA: NEGATIVE mg/dL
HGB URINE DIPSTICK: NEGATIVE
KETONES UR: NEGATIVE mg/dL
LEUKOCYTES UA: NEGATIVE
Nitrite: NEGATIVE
Protein, ur: NEGATIVE mg/dL
SPECIFIC GRAVITY, URINE: 1.015 (ref 1.005–1.030)
Urobilinogen, UA: 0.2 mg/dL (ref 0.0–1.0)
pH: 6 (ref 5.0–8.0)

## 2016-01-19 NOTE — Progress Notes (Signed)
Received a call from Marlin CanaryMary, [facility], and was informed that they had received a specimen that they normally do not run.  Corrie DandyMary stated that she believes it was a wet prep.  [facility] to the office so that we can send specimen to correct carrier.

## 2016-01-19 NOTE — Progress Notes (Signed)
States feels like baby movement is less for about a week, but feels pain instead of movement. States feels moisture in perineal area all the time- enough that she wears a mini pad. States worse with activity.

## 2016-01-19 NOTE — Progress Notes (Signed)
   PRENATAL VISIT NOTE  Subjective:  Sherry Jimenez is a 35 y.o. [id number] at 7329w4d being seen today for ongoing prenatal care.  She is currently monitored for the following issues for this high-risk pregnancy and has Obesity; Essential hypertension, benign; Mixed anxiety and depressive disorder; Fibromyalgia; Supervision of high risk pregnancy, antepartum; Chronic hypertension during pregnancy, antepartum; Advanced maternal age in multigravida; Obesity affecting pregnancy, antepartum; Cannabis abuse; and Gestational diabetes mellitus (GDM) affecting pregnancy, antepartum (HCC) on her problem list.  Patient reports vaginal discharge / wet.  Contractions: Irregular. Vag. Bleeding: None.  Movement: (!) Decreased. Denies leaking of fluid.   The following portions of the patient's history were reviewed and updated as appropriate: allergies, current medications, past family history, past medical history, past social history, past surgical history and problem list. Problem list updated.  Objective:   Vitals:   01/19/16 0807  BP: 117/78  Pulse: 98  Weight: 253 lb 6.4 oz (114.9 kg)    Fetal Status: Fetal Heart Rate (bpm): NST   Movement: (!) Decreased     General:  Alert, oriented and cooperative. Patient is in no acute distress.  Skin: Skin is warm and dry. No rash noted.   Cardiovascular: Normal heart rate noted  Respiratory: Normal respiratory effort, no problems with respiration noted  Abdomen: Soft, gravid, appropriate for gestational age. Pain/Pressure: Present     Pelvic:  Cervical exam deferred      SSE:neg pooling, negative fern  Extremities: Normal range of motion.  Edema: None  Mental Status: Normal mood and affect. Normal behavior. Normal judgment and thought content.   Urinalysis: Urine Protein: Negative Urine Glucose: Negative  Assessment and Plan:  Pregnancy: [id number] at 4029w4d  1. Chronic hypertension during pregnancy, antepartum - Fetal nonstress test--REACTIVE -  GC/Chlamydia probe amp ([place])[facility] -Serial US and 2x weekly testing -Induction at 39 weeks  2. Gestational diabetes mellitus (GDM) affecting pregnancy, antepartum (HCC)  - Fetal nonstress test - GC/Chlamydia probe amp ([place] -pt reports CBGs are well controlled with Glyburide  Preterm labor symptoms and general obstetric precautions including but not limited to vaginal bleeding, contractions, leaking of fluid and fetal movement were reviewed in detail with the patient. Please refer to After Visit Summary for other counseling recommendations.  Return in about 3 days (around 01/22/2016) for as scheduled. Lesly Dukes.  Quadre Bristol H Kenneisha Cochrane, MD

## 2016-01-20 ENCOUNTER — Ambulatory Visit (HOSPITAL_COMMUNITY): Payer: BC Managed Care – PPO

## 2016-01-20 NOTE — Addendum Note (Signed)
Addended by: Faythe CasaBELLAMY, Grainger Mccarley M on: 01/20/2016 12:23 PM   Modules accepted: Orders

## 2016-01-21 LAB — WET PREP, GENITAL
TRICH WET PREP: NONE SEEN
Yeast Wet Prep HPF POC: NONE SEEN

## 2016-01-22 ENCOUNTER — Ambulatory Visit (INDEPENDENT_AMBULATORY_CARE_PROVIDER_SITE_OTHER): Payer: BC Managed Care – PPO | Admitting: *Deleted

## 2016-01-22 VITALS — BP 117/84 | HR 93

## 2016-01-22 DIAGNOSIS — Z36 Encounter for antenatal screening of mother: Secondary | ICD-10-CM

## 2016-01-22 DIAGNOSIS — O24415 Gestational diabetes mellitus in pregnancy, controlled by oral hypoglycemic drugs: Secondary | ICD-10-CM | POA: Diagnosis not present

## 2016-01-22 DIAGNOSIS — I10 Essential (primary) hypertension: Secondary | ICD-10-CM | POA: Diagnosis not present

## 2016-01-22 DIAGNOSIS — O10913 Unspecified pre-existing hypertension complicating pregnancy, third trimester: Secondary | ICD-10-CM | POA: Diagnosis not present

## 2016-01-22 NOTE — Progress Notes (Signed)
NST performed today was reviewed and was found to be reactive.  AFI was also normal.  Continue recommended antenatal testing and prenatal care. 

## 2016-01-26 ENCOUNTER — Ambulatory Visit (INDEPENDENT_AMBULATORY_CARE_PROVIDER_SITE_OTHER): Payer: BC Managed Care – PPO | Admitting: Obstetrics and Gynecology

## 2016-01-26 VITALS — BP 114/75 | HR 90 | Wt 254.4 lb

## 2016-01-26 DIAGNOSIS — O0993 Supervision of high risk pregnancy, unspecified, third trimester: Secondary | ICD-10-CM

## 2016-01-26 DIAGNOSIS — I10 Essential (primary) hypertension: Secondary | ICD-10-CM | POA: Diagnosis not present

## 2016-01-26 DIAGNOSIS — O10913 Unspecified pre-existing hypertension complicating pregnancy, third trimester: Secondary | ICD-10-CM | POA: Diagnosis not present

## 2016-01-26 DIAGNOSIS — O24415 Gestational diabetes mellitus in pregnancy, controlled by oral hypoglycemic drugs: Secondary | ICD-10-CM

## 2016-01-26 LAB — POCT URINALYSIS DIP (DEVICE)
GLUCOSE, UA: NEGATIVE mg/dL
HGB URINE DIPSTICK: NEGATIVE
Leukocytes, UA: NEGATIVE
Nitrite: NEGATIVE
PH: 6 (ref 5.0–8.0)
PROTEIN: 30 mg/dL — AB
SPECIFIC GRAVITY, URINE: 1.02 (ref 1.005–1.030)
UROBILINOGEN UA: 1 mg/dL (ref 0.0–1.0)

## 2016-01-26 NOTE — Progress Notes (Signed)
Subjective:  Sherry Jimenez is a 35 y.o. [id number] at 8264w4d being seen today for ongoing prenatal care.  She is currently monitored for the following issues for this high-risk pregnancy and has Obesity; Essential hypertension, benign; Mixed anxiety and depressive disorder; Fibromyalgia; Supervision of high risk pregnancy, antepartum; Chronic hypertension during pregnancy, antepartum; Advanced maternal age in multigravida; Obesity affecting pregnancy, antepartum; Cannabis abuse; and Gestational diabetes mellitus (GDM) affecting pregnancy, antepartum (HCC) on her problem list.  Patient reports no complaints.  Contractions: Irregular. Vag. Bleeding: None.  Movement: Present. Denies leaking of fluid.   The following portions of the patient's history were reviewed and updated as appropriate: allergies, current medications, past family history, past medical history, past social history, past surgical history and problem list. Problem list updated.  Objective:   Vitals:   01/26/16 1524  BP: 114/75  Pulse: 90  Weight: 254 lb 6.4 oz (115.4 kg)    Fetal Status: Fetal Heart Rate (bpm): NST Fundal Height: 38 cm Movement: Present     General:  Alert, oriented and cooperative. Patient is in no acute distress.  Skin: Skin is warm and dry. No rash noted.   Cardiovascular: Normal heart rate noted  Respiratory: Normal respiratory effort, no problems with respiration noted  Abdomen: Soft, gravid, appropriate for gestational age. Pain/Pressure: Present     Pelvic:  Cervical exam deferred        Extremities: Normal range of motion.  Edema: None  Mental Status: Normal mood and affect. Normal behavior. Normal judgment and thought content.   Urinalysis: Urine Protein: 1+ Urine Glucose: Negative  Assessment and Plan:  Pregnancy: [id number] at 7264w4d  1. Chronic hypertension complicating or reason for care during pregnancy, third trimester  - Fetal nonstress test, reactive today Continue with antenatal  testing IOL next week  2. Gestational diabetes mellitus (GDM) in third trimester controlled on oral hypoglycemic drug Reports BS stable - Fetal nonstress test  3. Supervision of high risk pregnancy, antepartum, third trimester  Term labor symptoms and general obstetric precautions including but not limited to vaginal bleeding, contractions, leaking of fluid and fetal movement were reviewed in detail with the patient. Please refer to After Visit Summary for other counseling recommendations.  Return in about 3 days (around 01/29/2016) for as scheduled.   Hermina StaggersMichael L Symphoni Helbling, MD

## 2016-01-26 NOTE — Progress Notes (Signed)
US for growth scheduled on 9/21.  IOL scheduled 9/28

## 2016-01-29 ENCOUNTER — Encounter (HOSPITAL_COMMUNITY): Payer: Self-pay

## 2016-01-29 ENCOUNTER — Ambulatory Visit (INDEPENDENT_AMBULATORY_CARE_PROVIDER_SITE_OTHER): Payer: BC Managed Care – PPO | Admitting: *Deleted

## 2016-01-29 ENCOUNTER — Ambulatory Visit (HOSPITAL_COMMUNITY)
Admission: RE | Admit: 2016-01-29 | Discharge: 2016-01-29 | Disposition: A | Discharge status: Home / Self Care | Payer: BC Managed Care – PPO | Source: Ambulatory Visit | Attending: Obstetrics & Gynecology | Admitting: Obstetrics & Gynecology

## 2016-01-29 ENCOUNTER — Encounter (HOSPITAL_COMMUNITY): Payer: Self-pay | Admitting: *Deleted

## 2016-01-29 ENCOUNTER — Telehealth (HOSPITAL_COMMUNITY): Payer: Self-pay | Admitting: *Deleted

## 2016-01-29 VITALS — BP 125/85 | HR 104

## 2016-01-29 DIAGNOSIS — Z3A38 38 weeks gestation of pregnancy: Secondary | ICD-10-CM | POA: Diagnosis not present

## 2016-01-29 DIAGNOSIS — I10 Essential (primary) hypertension: Secondary | ICD-10-CM | POA: Diagnosis not present

## 2016-01-29 DIAGNOSIS — O10913 Unspecified pre-existing hypertension complicating pregnancy, third trimester: Secondary | ICD-10-CM

## 2016-01-29 DIAGNOSIS — O24415 Gestational diabetes mellitus in pregnancy, controlled by oral hypoglycemic drugs: Secondary | ICD-10-CM

## 2016-01-29 DIAGNOSIS — O10013 Pre-existing essential hypertension complicating pregnancy, third trimester: Secondary | ICD-10-CM | POA: Diagnosis not present

## 2016-01-29 DIAGNOSIS — O99213 Obesity complicating pregnancy, third trimester: Secondary | ICD-10-CM | POA: Insufficient documentation

## 2016-01-29 DIAGNOSIS — O24419 Gestational diabetes mellitus in pregnancy, unspecified control: Secondary | ICD-10-CM | POA: Diagnosis not present

## 2016-01-29 DIAGNOSIS — O09523 Supervision of elderly multigravida, third trimester: Secondary | ICD-10-CM | POA: Diagnosis present

## 2016-01-29 DIAGNOSIS — O99323 Drug use complicating pregnancy, third trimester: Secondary | ICD-10-CM | POA: Diagnosis not present

## 2016-01-29 NOTE — Telephone Encounter (Signed)
Preadmission screen  

## 2016-01-29 NOTE — Progress Notes (Signed)
US for growth today.  IOL scheduled 9/28.

## 2016-02-02 ENCOUNTER — Other Ambulatory Visit: Payer: BC Managed Care – PPO | Admitting: Obstetrics and Gynecology

## 2016-02-05 ENCOUNTER — Encounter (HOSPITAL_COMMUNITY): Payer: Self-pay

## 2016-02-05 ENCOUNTER — Other Ambulatory Visit: Payer: BC Managed Care – PPO

## 2016-02-05 ENCOUNTER — Inpatient Hospital Stay (HOSPITAL_COMMUNITY): Payer: BC Managed Care – PPO | Admitting: Anesthesiology

## 2016-02-05 ENCOUNTER — Inpatient Hospital Stay (HOSPITAL_COMMUNITY)
Admission: RE | Admit: 2016-02-05 | Discharge: 2016-02-07 | DRG: 767 | Disposition: A | Discharge status: Home / Self Care | Payer: BC Managed Care – PPO | Source: Ambulatory Visit | Attending: Obstetrics & Gynecology | Admitting: Obstetrics & Gynecology

## 2016-02-05 DIAGNOSIS — O134 Gestational [pregnancy-induced] hypertension without significant proteinuria, complicating childbirth: Secondary | ICD-10-CM

## 2016-02-05 DIAGNOSIS — Z302 Encounter for sterilization: Secondary | ICD-10-CM | POA: Diagnosis not present

## 2016-02-05 DIAGNOSIS — Z87891 Personal history of nicotine dependence: Secondary | ICD-10-CM | POA: Diagnosis not present

## 2016-02-05 DIAGNOSIS — Z3A39 39 weeks gestation of pregnancy: Secondary | ICD-10-CM

## 2016-02-05 DIAGNOSIS — O24424 Gestational diabetes mellitus in childbirth, insulin controlled: Secondary | ICD-10-CM | POA: Diagnosis present

## 2016-02-05 DIAGNOSIS — Z8249 Family history of ischemic heart disease and other diseases of the circulatory system: Secondary | ICD-10-CM | POA: Diagnosis not present

## 2016-02-05 DIAGNOSIS — O1002 Pre-existing essential hypertension complicating childbirth: Principal | ICD-10-CM | POA: Diagnosis present

## 2016-02-05 DIAGNOSIS — Z6841 Body Mass Index (BMI) 40.0 and over, adult: Secondary | ICD-10-CM

## 2016-02-05 DIAGNOSIS — O99214 Obesity complicating childbirth: Secondary | ICD-10-CM | POA: Diagnosis present

## 2016-02-05 DIAGNOSIS — Z880 Allergy status to penicillin: Secondary | ICD-10-CM | POA: Diagnosis not present

## 2016-02-05 DIAGNOSIS — O24419 Gestational diabetes mellitus in pregnancy, unspecified control: Secondary | ICD-10-CM

## 2016-02-05 DIAGNOSIS — O10919 Unspecified pre-existing hypertension complicating pregnancy, unspecified trimester: Secondary | ICD-10-CM | POA: Diagnosis present

## 2016-02-05 DIAGNOSIS — Z833 Family history of diabetes mellitus: Secondary | ICD-10-CM | POA: Diagnosis not present

## 2016-02-05 DIAGNOSIS — O09523 Supervision of elderly multigravida, third trimester: Secondary | ICD-10-CM

## 2016-02-05 DIAGNOSIS — O99213 Obesity complicating pregnancy, third trimester: Secondary | ICD-10-CM

## 2016-02-05 LAB — GLUCOSE, CAPILLARY
GLUCOSE-CAPILLARY: 65 mg/dL (ref 65–99)
GLUCOSE-CAPILLARY: 99 mg/dL (ref 65–99)
Glucose-Capillary: 97 mg/dL (ref 65–99)

## 2016-02-05 LAB — CBC
HCT: 31.4 % — ABNORMAL LOW (ref 36.0–46.0)
HCT: 32 % — ABNORMAL LOW (ref 36.0–46.0)
Hemoglobin: 10.8 g/dL — ABNORMAL LOW (ref 12.0–15.0)
Hemoglobin: 10.8 g/dL — ABNORMAL LOW (ref 12.0–15.0)
MCH: 27 pg (ref 26.0–34.0)
MCH: 27 pg (ref 26.0–34.0)
MCHC: 34.4 g/dL (ref 30.0–36.0)
MCHC: 33.8 g/dL (ref 30.0–36.0)
MCV: 78.5 fL (ref 78.0–100.0)
MCV: 80 fL (ref 78.0–100.0)
PLATELETS: 236 10*3/uL (ref 150–400)
PLATELETS: 219 10*3/uL (ref 150–400)
RBC: 4 MIL/uL (ref 3.87–5.11)
RBC: 4 MIL/uL (ref 3.87–5.11)
RDW: 14.9 % (ref 11.5–15.5)
RDW: 15 % (ref 11.5–15.5)
WBC: 8.5 10*3/uL (ref 4.0–10.5)
WBC: 6.4 10*3/uL (ref 4.0–10.5)

## 2016-02-05 LAB — TYPE AND SCREEN
ABO/RH(D): O POS
Antibody Screen: NEGATIVE

## 2016-02-05 LAB — GLUCOSE, RANDOM: Glucose, Bld: 98 mg/dL (ref 65–99)

## 2016-02-05 LAB — RPR: RPR Ser Ql: NONREACTIVE

## 2016-02-05 MED ORDER — OXYCODONE-ACETAMINOPHEN 5-325 MG PO TABS: 1.0000 | ORAL_TABLET | ORAL | Status: DC | PRN

## 2016-02-05 MED ORDER — ACETAMINOPHEN 500 MG PO TABS
1000.0000 mg | ORAL_TABLET | Freq: Once | ORAL | Status: AC
  Administered 2016-02-05: 1000 mg via ORAL
  Filled 2016-02-05: qty 2

## 2016-02-05 MED ORDER — LACTATED RINGERS IV SOLN
500.0000 mL | Freq: Once | INTRAVENOUS | Status: AC
  Administered 2016-02-05: 500 mL via INTRAVENOUS

## 2016-02-05 MED ORDER — PHENYLEPHRINE 40 MCG/ML (10ML) SYRINGE FOR IV PUSH (FOR BLOOD PRESSURE SUPPORT)
80.0000 ug | PREFILLED_SYRINGE | INTRAVENOUS | Status: DC | PRN
  Filled 2016-02-05: qty 5

## 2016-02-05 MED ORDER — DEXTROSE 5 % IV SOLN
2.0000 g | Freq: Four times a day (QID) | INTRAVENOUS | Status: DC
  Administered 2016-02-05: 2 g via INTRAVENOUS
  Filled 2016-02-05 (×2): qty 2

## 2016-02-05 MED ORDER — OXYTOCIN BOLUS FROM INFUSION: 500.0000 mL | Freq: Once | INTRAVENOUS | Status: DC

## 2016-02-05 MED ORDER — GENTAMICIN SULFATE 40 MG/ML IJ SOLN
180.0000 mg | Freq: Three times a day (TID) | INTRAVENOUS | Status: DC
  Administered 2016-02-05: 180 mg via INTRAVENOUS
  Filled 2016-02-05 (×3): qty 4.5

## 2016-02-05 MED ORDER — FENTANYL 2.5 MCG/ML BUPIVACAINE 1/10 % EPIDURAL INFUSION (WH - ANES)
INTRAMUSCULAR | Status: AC
  Filled 2016-02-05: qty 125

## 2016-02-05 MED ORDER — EPHEDRINE 5 MG/ML INJ
10.0000 mg | INTRAVENOUS | Status: DC | PRN
  Filled 2016-02-05: qty 4

## 2016-02-05 MED ORDER — ZOLPIDEM TARTRATE 5 MG PO TABS
5.0000 mg | ORAL_TABLET | Freq: Every evening | ORAL | Status: DC | PRN
  Administered 2016-02-05: 5 mg via ORAL
  Filled 2016-02-05: qty 1

## 2016-02-05 MED ORDER — LIDOCAINE HCL (PF) 1 % IJ SOLN
30.0000 mL | INTRAMUSCULAR | Status: DC | PRN
  Filled 2016-02-05: qty 30

## 2016-02-05 MED ORDER — LACTATED RINGERS IV SOLN
INTRAVENOUS | Status: DC
Start: 2016-02-05 — End: 2016-02-06
  Administered 2016-02-05: 17:00:00 via INTRAUTERINE

## 2016-02-05 MED ORDER — SOD CITRATE-CITRIC ACID 500-334 MG/5ML PO SOLN: 30.0000 mL | ORAL | Status: DC | PRN

## 2016-02-05 MED ORDER — TERBUTALINE SULFATE 1 MG/ML IJ SOLN
0.2500 mg | Freq: Once | INTRAMUSCULAR | Status: DC | PRN
  Filled 2016-02-05: qty 1

## 2016-02-05 MED ORDER — PHENYLEPHRINE 40 MCG/ML (10ML) SYRINGE FOR IV PUSH (FOR BLOOD PRESSURE SUPPORT)
PREFILLED_SYRINGE | INTRAVENOUS | Status: AC
  Filled 2016-02-05: qty 10

## 2016-02-05 MED ORDER — LIDOCAINE HCL (PF) 1 % IJ SOLN
INTRAMUSCULAR | Status: DC | PRN
  Administered 2016-02-05: 4 mL via EPIDURAL
  Administered 2016-02-05: 3 mL via EPIDURAL

## 2016-02-05 MED ORDER — FENTANYL 2.5 MCG/ML BUPIVACAINE 1/10 % EPIDURAL INFUSION (WH - ANES)
14.0000 mL/h | INTRAMUSCULAR | Status: DC | PRN
  Administered 2016-02-05: 12 mL/h via EPIDURAL
  Administered 2016-02-05: 14 mL/h via EPIDURAL
  Filled 2016-02-05: qty 125

## 2016-02-05 MED ORDER — OXYTOCIN 40 UNITS IN LACTATED RINGERS INFUSION - SIMPLE MED
1.0000 m[IU]/min | INTRAVENOUS | Status: DC
  Administered 2016-02-05: 2 m[IU]/min via INTRAVENOUS
  Filled 2016-02-05: qty 1000

## 2016-02-05 MED ORDER — DIPHENHYDRAMINE HCL 50 MG/ML IJ SOLN: 12.5000 mg | INTRAMUSCULAR | Status: DC | PRN

## 2016-02-05 MED ORDER — SODIUM CHLORIDE 0.9 % IV SOLN
2.0000 g | Freq: Four times a day (QID) | INTRAVENOUS | Status: DC
  Filled 2016-02-05 (×3): qty 2000

## 2016-02-05 MED ORDER — ACETAMINOPHEN 325 MG PO TABS: 650.0000 mg | ORAL_TABLET | ORAL | Status: DC | PRN

## 2016-02-05 MED ORDER — OXYCODONE-ACETAMINOPHEN 5-325 MG PO TABS: 2.0000 | ORAL_TABLET | ORAL | Status: DC | PRN

## 2016-02-05 MED ORDER — LACTATED RINGERS IV SOLN
500.0000 mL | INTRAVENOUS | Status: DC | PRN
  Administered 2016-02-05 (×2): 500 mL via INTRAVENOUS

## 2016-02-05 MED ORDER — OXYTOCIN 40 UNITS IN LACTATED RINGERS INFUSION - SIMPLE MED: 2.5000 [IU]/h | INTRAVENOUS | Status: DC

## 2016-02-05 MED ORDER — ONDANSETRON HCL 4 MG/2ML IJ SOLN
4.0000 mg | Freq: Four times a day (QID) | INTRAMUSCULAR | Status: DC | PRN
  Administered 2016-02-05 (×2): 4 mg via INTRAVENOUS
  Filled 2016-02-05 (×2): qty 2

## 2016-02-05 MED ORDER — LACTATED RINGERS IV SOLN
INTRAVENOUS | Status: DC
  Administered 2016-02-05 (×3): via INTRAVENOUS

## 2016-02-05 MED ORDER — FENTANYL CITRATE (PF) 100 MCG/2ML IJ SOLN
100.0000 ug | INTRAMUSCULAR | Status: DC | PRN
  Administered 2016-02-05: 100 ug via INTRAVENOUS
  Filled 2016-02-05: qty 2

## 2016-02-05 MED ORDER — MISOPROSTOL 25 MCG QUARTER TABLET
25.0000 ug | ORAL_TABLET | ORAL | Status: DC | PRN
  Administered 2016-02-05: 25 ug via VAGINAL
  Filled 2016-02-05: qty 1
  Filled 2016-02-05: qty 0.25

## 2016-02-05 NOTE — Progress Notes (Signed)
Pharmacy Antibiotic Note  Sherry QuinonesShanita S Jimenez is a 35 y.o. female admitted on 02/05/2016 [facility].  Pharmacy has been consulted for Gentamicin dosing for Triple I due to maternal temp durint labor.  Plan: Gentamicin 180mg  IV q8h Check SCr if continued postpartum   Height: 5\' 2"  (157.5 cm) Weight: 256 lb (116.1 kg) IBW/kg (Calculated) : 50.1  Adjusted Dosing weight: 69.9kg  Temp (24hrs), Avg:98.5 F (36.9 C), Min:97.6 F (36.4 C), Max:100.7 F (38.2 C)   Recent Labs Lab 02/05/16 0100 02/05/16 0940  WBC 8.5 6.4    CrCl cannot be calculated (Patient's most recent lab result is older than the maximum 21 days allowed.).    Allergies  Allergen Reactions  . Azithromycin Other (See Comments)    Vomiting   . Penicillins Nausea And Vomiting    Has patient had a PCN reaction causing immediate rash, facial/tongue/throat swelling, SOB or lightheadedness with hypotension: No Has patient had a PCN reaction causing severe rash involving mucus membranes or skin necrosis: No Has patient had a PCN reaction that required hospitalization No Has patient had a PCN reaction occurring within the last 10 years: No If all of the above answers are "NO", then may proceed with Cephalosporin use.    Antimicrobials this admission: Cefoxitin 2 gram IV x 1 due to PCN allergy which was clarified as N/V so medication changed to Ampicillin Ampicillin 2 gram IV q6h  9/28 >>    Thank you for allowing pharmacy to be a part of this patient's care.  Sherry Jimenez, Sherry Jimenez 02/05/2016 5:31 PM

## 2016-02-05 NOTE — Progress Notes (Signed)
Patient ID: Sherry QuinonesShanita S Stockton-Saleh, female   DOB: 09/16/1980, 35 y.o.   MRN: [id number]  Received cytotec x 1 during the night; now FHT is 150s with less variability and occ early onset variables  BP 132/84 Ctx irreg 2-7 mins Cx 1-2/50/-2  IUP @ term cHTN GDM A2  Due to FHT decrease variability, prefer not use cytotec again Foley bulb placed and inflated with 50cc fluid- pt tol well Rev'd IOL process and what to expect  Cam HaiSHAW, Wofford Stratton CNM 02/05/2016 9:04 AM

## 2016-02-05 NOTE — Progress Notes (Signed)
Patient ID: Sherry Jimenez, female   DOB: 03/24/1981, 35 y.o.   MRN: [id number] LABOR PROGRESS NOTE  Sherry Jimenez is a 35 y.o. [id number] at 7534w0d  admitted for IOL for cHTN, GDMA2  Subjective: Doing well, comfortable. Feeling some rectal pressure with contractions.  Objective: BP (!) 145/91   Pulse (!) 113   Temp 100.1 F (37.8 C) (Axillary)   Resp 18   Ht 5\' 2"  (1.575 m)   Wt 116.1 kg (256 lb)   LMP 05/08/2015   SpO2 100%   BMI 46.82 kg/m  or  Vitals:   02/05/16 1830 02/05/16 1900 02/05/16 1931 02/05/16 2001  BP: (!) 141/92 140/83 137/90 (!) 145/91  Pulse: (!) 107 (!) 115 (!) 114 (!) 113  Resp: 18   18  Temp:   100.1 F (37.8 C)   TempSrc:   Axillary   SpO2:      Weight:      Height:         Dilation: 7 Effacement (%): 80 Cervical Position: Middle Station: -2, -1 Presentation: Vertex Exam by:: K Mcleod RN FHT: 155 baseline, moderate variability, +accelerations, no decelerations Uterine activity: q1-513min  Labs: Lab Results  Component Value Date   WBC 6.4 02/05/2016   HGB 10.8 (L) 02/05/2016   HCT 32.0 (L) 02/05/2016   MCV 80.0 02/05/2016   PLT 219 02/05/2016    Patient Active Problem List   Diagnosis Date Noted  . Chronic hypertension in pregnancy 02/05/2016  . Gestational diabetes mellitus (GDM) affecting pregnancy, antepartum (HCC) 12/22/2015  . Obesity affecting pregnancy, antepartum 10/09/2015  . Supervision of high risk pregnancy, antepartum 07/17/2015  . Chronic hypertension during pregnancy, antepartum 07/17/2015  . Advanced maternal age in multigravida 07/17/2015  . 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 Fibromyalgia 05/29/2015  . Cannabis abuse 10/21/2014  . Essential hypertension, benign 03/23/2012  . Obesity 10/19/2010    Assessment / Plan: 35 y.o. [id number] at 5934w0d here for IOL for gHTN, GDMA2  Labor: continue amnioinfusion given improvement in FHT. Continue pitocin Fetal Wellbeing:  Category I Pain  Control:  epidural Anticipated MOD:  SVD  Leland HerElsia J Leamon Palau, DO PGY-1 9/28/20178:56 PM

## 2016-02-05 NOTE — Progress Notes (Signed)
Pt evaluated at beginning of shift. Pt here for IOL for cHTN and gDMA2. She was on glyburide BID. Pt doing well at this time, but getting uncomfortable. Requesting epidural. Cervix 4cm. Pt start on pitocin 2x2. Sugars ordered ever 4 hours. BP have been less than 140/90. Expect SVD.

## 2016-02-05 NOTE — Progress Notes (Signed)
After AROM pt with recurrent variables and increasing base line. Pt found to be febrile to 100.7. Will give tylenol 1g x1 and start ceofxitin and gent. Pt is PCN allergic. Start amnio infusion as well for recurrent variables.

## 2016-02-05 NOTE — Progress Notes (Signed)
Patient ID: Sherry Jimenez, female   DOB: 04/15/1981, 35 y.o.   MRN: [id number] LABOR PROGRESS NOTE  Sherry Jimenez is a 35 y.o. [id number] at 2119w0d  admitted for IOL for cHTN and gDMA2.   Subjective: Resting comfortably. S/p AROM and IUPC placement. On cefoxitin and gent after becoming febrile to 100.4F this afternoon.  Objective: BP (!) 145/91   Pulse (!) 113   Temp 100.1 F (37.8 C) (Axillary)   Resp 18   Ht 5\' 2"  (1.575 m)   Wt 116.1 kg (256 lb)   LMP 05/08/2015   SpO2 100%   BMI 46.82 kg/m  or  Vitals:   02/05/16 1830 02/05/16 1900 02/05/16 1931 02/05/16 2001  BP: (!) 141/92 140/83 137/90 (!) 145/91  Pulse: (!) 107 (!) 115 (!) 114 (!) 113  Resp: 18   18  Temp:   100.1 F (37.8 C)   TempSrc:   Axillary   SpO2:      Weight:      Height:         Dilation: 7 Effacement (%): 80 Cervical Position: Middle Station: -2, -1 Presentation: Vertex Exam by:: K Mcleod RN FHT: baseline 155, moderate variability, +accels, recurrent variables Uterine activity: +contractions  Labs: Lab Results  Component Value Date   WBC 6.4 02/05/2016   HGB 10.8 (L) 02/05/2016   HCT 32.0 (L) 02/05/2016   MCV 80.0 02/05/2016   PLT 219 02/05/2016    Patient Active Problem List   Diagnosis Date Noted  . Chronic hypertension in pregnancy 02/05/2016  . Gestational diabetes mellitus (GDM) affecting pregnancy, antepartum (HCC) 12/22/2015  . Obesity affecting pregnancy, antepartum 10/09/2015  . Supervision of high risk pregnancy, antepartum 07/17/2015  . Chronic hypertension during pregnancy, antepartum 07/17/2015  . Advanced maternal age in multigravida 07/17/2015  . 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 Fibromyalgia 05/29/2015  . Cannabis abuse 10/21/2014  . Essential hypertension, benign 03/23/2012  . Obesity 10/19/2010    Assessment / Plan: 35 y.o. [id number] at 7719w0d here for IOL for cHTN and gDMA2.  Labor: Amnioinfusion given with improvement in fetal  strip and IV pitocin Fetal Wellbeing:  Category I Pain Control:  Epidural placed this morning Anticipated MOD:  SVD  Franki CabotZachary L Kennan Detter, Medical Student  9/28/20178:55 PM

## 2016-02-05 NOTE — H&P (Signed)
Sherry Jimenez is a 35 y.o. female [id number] @ 39.0wks presenting for IOL due to cHTN, GDM A2. [facility] and has been remarkable for 1) cHTN 2) GDM A2 3) obesity 4) desires BTL  OB History    Gravida Para Term Preterm AB Living   5 3 3   1 3    SAB TAB Ectopic Multiple Live Births   1       3     Past Medical History:  Diagnosis Date  . Anemia   . BV (bacterial vaginosis)   . Cholelithiasis   . Fibromyalgia   . Gestational diabetes    glyburide  . Hypertension   . UTI (lower urinary tract infection)   . Vaginal yeast infection    Past Surgical History:  Procedure Laterality Date  . CHOLECYSTECTOMY    . TONSILECTOMY, ADENOIDECTOMY, BILATERAL MYRINGOTOMY AND TUBES     Family History: family history includes Alcohol abuse in her father; Diabetes in her father; Hypertension in her father. Social History:  reports that she quit smoking about 10 months ago. She has a 3.00 pack-year smoking history. She has never used smokeless tobacco. She reports that she does not drink alcohol or use drugs.     Maternal Diabetes: Yes:  Diabetes Type:  Insulin/Medication controlled Genetic Screening: Normal Maternal Ultrasounds/Referrals: Normal Fetal Ultrasounds or other Referrals:  None Maternal Substance Abuse:  No Significant Maternal Medications:  None Significant Maternal Lab Results:  Lab values include: Group B Strep negative Other Comments:  None  ROS History    Vitals: 97.6, 87, 18, 110/73  Last menstrual period 05/08/2015. Exam Physical Exam  Constitutional: She is oriented to person, place, and time. She appears well-developed.  HENT:  Head: Normocephalic.  Neck: Normal range of motion.  Cardiovascular: Normal rate.   Respiratory: Effort normal.  GI:  EFM 150-160, +accels, occ mi variables Irreg mild ctx  Musculoskeletal: Normal range of motion.  Neurological: She is alert and oriented to person, place, and time.  Skin: Skin is warm  and dry.  Psychiatric: She has a normal mood and affect. Her behavior is normal. Thought content normal.    Prenatal labs: ABO, Rh: O/POS/-- (03/09 0931) Antibody: NEG (03/09 0931) Rubella: 4.78 (03/09 0931) RPR: NON REAC (07/17 1246)  HBsAg: NEGATIVE (03/09 0931)  HIV: NONREACTIVE (07/17 1246)  GBS: Negative (09/05 0000)   Assessment/Plan: IUP@39 .0wks cHTN GDM A2  [facility]/ [facility] to start- prefers to not use foley unless necessary Anticipate SVD   SHAW, KIMBERLY CNM 02/05/2016, 12:50 AM

## 2016-02-05 NOTE — Progress Notes (Signed)
Called to evaulate pt. Pt with minimal variability and early decelerations. Recently had 15x15 accelerations and good variability. Will AROM pt. 6/80/-2. IUPC placed. Will reposition pt on side.

## 2016-02-05 NOTE — Anesthesia Procedure Notes (Signed)
Epidural Patient location during procedure: OB Start time: 02/05/2016 10:18 AM  Staffing Anesthesiologist: Mal AmabileFOSTER, Kinzlee Selvy Performed: anesthesiologist   Preanesthetic Checklist Completed: patient identified, site marked, surgical consent, pre-op evaluation, timeout performed, IV checked, risks and benefits discussed and monitors and equipment checked  Epidural Patient position: sitting Prep: site prepped and draped and DuraPrep Patient monitoring: continuous pulse ox and blood pressure Approach: midline Location: L3-L4 Injection technique: LOR air  Needle:  Needle type: Tuohy  Needle gauge: 17 G Needle length: 9 cm and 9 Needle insertion depth: 5 cm cm Catheter type: closed end flexible Catheter size: 19 Gauge Catheter at skin depth: 10 cm Test dose: negative and Other  Assessment Events: blood not aspirated, injection not painful, no injection resistance, negative IV test and no paresthesia  Additional Notes Patient identified. Risks and benefits discussed including failed block, incomplete  Pain control, post dural puncture headache, nerve damage, paralysis, blood pressure Changes, nausea, vomiting, reactions to medications-both toxic and allergic and post Partum back pain. All questions were answered. Patient expressed understanding and wished to proceed. Sterile technique was used throughout procedure. Epidural site was Dressed with sterile barrier dressing. No paresthesias, signs of intravascular injection Or signs of intrathecal spread were encountered.  Patient was more comfortable after the epidural was dosed. Please see RN's note for documentation of vital signs and FHR which are stable.

## 2016-02-05 NOTE — Anesthesia Rounding Note (Signed)
  [facility] Epidural Rounding Note  Patient: Sherry Jimenez, 35 y.o., female  Patient's current pain level: Pain Score: 0-No pain (02/05/16 1035)  Agreed upon pain management level: 3  Epidural intervention: No   Comments: Patient states epidural working well, now beginning to push  [facility] CenterEIGHT,Shamariah Shewmake 02/05/2016

## 2016-02-05 NOTE — Anesthesia Pain Management Evaluation Note (Signed)
  [facility] Pain Management Visit Note  Patient: Sherry Jimenez, 35 y.o., female  "Hello I am a member of the anesthesia team [facility]. We have an anesthesia team available at all times to provide care throughout the hospital, including epidural management and anesthesia for C-section. I don't know your plan for the delivery whether it a natural birth, water birth, IV sedation, nitrous supplementation, doula or epidural, but we want to meet your pain goals."   1.Was your pain managed to your expectations on prior hospitalizations?   Yes   2.What is your expectation for pain management during this hospitalization?     Epidural and IV pain meds  3.How can we help you reach that goal? IV pain meds, epidural.  Record the patient's initial score and the patient's pain goal.   Pain: 3  Pain Goal: 3 The [facility] wants you to be able to say your pain was always managed very well.  Satin Boal L 02/05/2016

## 2016-02-05 NOTE — Anesthesia Preprocedure Evaluation (Signed)
Anesthesia Evaluation  Patient identified by MRN, date of birth, ID band Patient awake    Reviewed: Allergy & Precautions, Patient's Chart, lab work & pertinent test results  Airway Mallampati: III  TM Distance: >3 FB Neck ROM: Full    Dental no notable dental hx. (+) Teeth Intact   Pulmonary former smoker,    Pulmonary exam normal breath sounds clear to auscultation       Cardiovascular hypertension, Normal cardiovascular exam Rhythm:Regular Rate:Normal  cHTN- on no Rx currently   Neuro/Psych PSYCHIATRIC DISORDERS  Neuromuscular disease    GI/Hepatic   Endo/Other  diabetes, Well Controlled, GestationalMorbid obesity  Renal/GU   negative genitourinary   Musculoskeletal negative musculoskeletal ROS (+) Fibromyalgia -  Abdominal (+) + obese,   Peds  Hematology  (+) anemia ,   Anesthesia Other Findings   Reproductive/Obstetrics                             Lab Results  Component Value Date   WBC 6.4 02/05/2016   HGB 10.8 (L) 02/05/2016   HCT 32.0 (L) 02/05/2016   MCV 80.0 02/05/2016   PLT 219 02/05/2016    Anesthesia Physical  Anesthesia Plan  ASA: III  Anesthesia Plan: Epidural   Post-op Pain Management:    Induction:   Airway Management Planned: Natural Airway  Additional Equipment:   Intra-op Plan:   Post-operative Plan:   Informed Consent: I have reviewed the patients History and Physical, chart, labs and discussed the procedure including the risks, benefits and alternatives for the proposed anesthesia with the patient or authorized representative who has indicated his/her understanding and acceptance.     Plan Discussed with: Anesthesiologist  Anesthesia Plan Comments:         Anesthesia Quick Evaluation  

## 2016-02-06 ENCOUNTER — Inpatient Hospital Stay (HOSPITAL_COMMUNITY): Payer: BC Managed Care – PPO | Admitting: Certified Registered Nurse Anesthetist

## 2016-02-06 ENCOUNTER — Encounter (HOSPITAL_COMMUNITY)
Admission: RE | Disposition: A | Discharge status: Home / Self Care | Payer: Self-pay | Source: Ambulatory Visit | Attending: Obstetrics & Gynecology

## 2016-02-06 ENCOUNTER — Encounter (HOSPITAL_COMMUNITY): Payer: Self-pay

## 2016-02-06 DIAGNOSIS — Z302 Encounter for sterilization: Secondary | ICD-10-CM | POA: Diagnosis not present

## 2016-02-06 HISTORY — PX: TUBAL LIGATION: SHX77

## 2016-02-06 SURGERY — LIGATION, FALLOPIAN TUBE, POSTPARTUM
Anesthesia: Epidural | Site: Abdomen

## 2016-02-06 MED ORDER — ONDANSETRON HCL 4 MG PO TABS: 4.0000 mg | ORAL_TABLET | ORAL | Status: DC | PRN

## 2016-02-06 MED ORDER — ONDANSETRON HCL 4 MG/2ML IJ SOLN
INTRAMUSCULAR | Status: DC | PRN
  Administered 2016-02-06: 4 mg via INTRAVENOUS

## 2016-02-06 MED ORDER — BUPIVACAINE HCL (PF) 0.25 % IJ SOLN
INTRAMUSCULAR | Status: DC | PRN
  Administered 2016-02-06: 10 mL

## 2016-02-06 MED ORDER — BUPIVACAINE HCL (PF) 0.25 % IJ SOLN
INTRAMUSCULAR | Status: AC
  Filled 2016-02-06: qty 30

## 2016-02-06 MED ORDER — SIMETHICONE 80 MG PO CHEW: 80.0000 mg | CHEWABLE_TABLET | ORAL | Status: DC | PRN

## 2016-02-06 MED ORDER — MIDAZOLAM HCL 2 MG/2ML IJ SOLN
INTRAMUSCULAR | Status: DC | PRN
  Administered 2016-02-06 (×2): 0.5 mg via INTRAVENOUS

## 2016-02-06 MED ORDER — FENTANYL CITRATE (PF) 100 MCG/2ML IJ SOLN: 25.0000 ug | INTRAMUSCULAR | Status: DC | PRN

## 2016-02-06 MED ORDER — HYDROMORPHONE HCL 2 MG PO TABS
2.0000 mg | ORAL_TABLET | ORAL | Status: DC | PRN
  Administered 2016-02-07: 2 mg via ORAL
  Filled 2016-02-06: qty 1

## 2016-02-06 MED ORDER — METOCLOPRAMIDE HCL 10 MG PO TABS
10.0000 mg | ORAL_TABLET | Freq: Once | ORAL | Status: AC
  Administered 2016-02-06: 10 mg via ORAL
  Filled 2016-02-06: qty 1

## 2016-02-06 MED ORDER — METHYLERGONOVINE MALEATE 0.2 MG/ML IJ SOLN
0.2000 mg | INTRAMUSCULAR | Status: DC | PRN
Start: 2016-02-06 — End: 2016-02-07

## 2016-02-06 MED ORDER — LIDOCAINE HCL (PF) 2 % IJ SOLN
INTRAMUSCULAR | Status: DC | PRN
  Administered 2016-02-06: 2 mL via EPIDURAL

## 2016-02-06 MED ORDER — LIDOCAINE-EPINEPHRINE (PF) 2 %-1:200000 IJ SOLN
INTRAMUSCULAR | Status: AC
  Filled 2016-02-06: qty 20

## 2016-02-06 MED ORDER — LACTATED RINGERS IV SOLN
INTRAVENOUS | Status: DC | PRN
  Administered 2016-02-06: 13:00:00 via INTRAVENOUS

## 2016-02-06 MED ORDER — ONDANSETRON HCL 4 MG/2ML IJ SOLN
INTRAMUSCULAR | Status: AC
  Filled 2016-02-06: qty 2

## 2016-02-06 MED ORDER — ACETAMINOPHEN 325 MG PO TABS: 650.0000 mg | ORAL_TABLET | ORAL | Status: DC | PRN

## 2016-02-06 MED ORDER — DOCUSATE SODIUM 100 MG PO CAPS
100.0000 mg | ORAL_CAPSULE | Freq: Two times a day (BID) | ORAL | Status: DC
  Administered 2016-02-06 – 2016-02-07 (×3): 100 mg via ORAL
  Filled 2016-02-06 (×3): qty 1

## 2016-02-06 MED ORDER — BISACODYL 10 MG RE SUPP
10.0000 mg | Freq: Every day | RECTAL | Status: DC | PRN
  Administered 2016-02-07: 10 mg via RECTAL
  Filled 2016-02-06: qty 1

## 2016-02-06 MED ORDER — ONDANSETRON HCL 4 MG/2ML IJ SOLN: 4.0000 mg | INTRAMUSCULAR | Status: DC | PRN

## 2016-02-06 MED ORDER — COCONUT OIL OIL: 1.0000 "application " | TOPICAL_OIL | Status: DC | PRN

## 2016-02-06 MED ORDER — FAMOTIDINE 20 MG PO TABS
40.0000 mg | ORAL_TABLET | Freq: Once | ORAL | Status: AC
  Administered 2016-02-06: 40 mg via ORAL
  Filled 2016-02-06: qty 2

## 2016-02-06 MED ORDER — LIDOCAINE-EPINEPHRINE (PF) 2 %-1:200000 IJ SOLN
INTRAMUSCULAR | Status: DC | PRN
  Administered 2016-02-06: 5 mL via PERINEURAL
  Administered 2016-02-06: 2 mL via INTRADERMAL
  Administered 2016-02-06: 5 mL via INTRADERMAL
  Administered 2016-02-06: 3 mL via PERINEURAL
  Administered 2016-02-06: 3 mL via INTRADERMAL

## 2016-02-06 MED ORDER — LACTATED RINGERS IV SOLN
INTRAVENOUS | Status: DC
  Administered 2016-02-06: 12:00:00 via INTRAVENOUS
  Administered 2016-02-06: 20 mL/h via INTRAVENOUS

## 2016-02-06 MED ORDER — TETANUS-DIPHTH-ACELL PERTUSSIS 5-2.5-18.5 LF-MCG/0.5 IM SUSP: 0.5000 mL | Freq: Once | INTRAMUSCULAR | Status: DC

## 2016-02-06 MED ORDER — INFLUENZA VAC SPLIT QUAD 0.5 ML IM SUSY
0.5000 mL | PREFILLED_SYRINGE | INTRAMUSCULAR | Status: AC
  Administered 2016-02-07: 0.5 mL via INTRAMUSCULAR
  Filled 2016-02-06: qty 0.5

## 2016-02-06 MED ORDER — KETOROLAC TROMETHAMINE 30 MG/ML IJ SOLN
INTRAMUSCULAR | Status: DC | PRN
  Administered 2016-02-06: 30 mg via INTRAVENOUS

## 2016-02-06 MED ORDER — IBUPROFEN 600 MG PO TABS
600.0000 mg | ORAL_TABLET | Freq: Four times a day (QID) | ORAL | Status: DC
  Administered 2016-02-06 – 2016-02-07 (×4): 600 mg via ORAL
  Filled 2016-02-06 (×4): qty 1

## 2016-02-06 MED ORDER — FLEET ENEMA 7-19 GM/118ML RE ENEM: 1.0000 | ENEMA | Freq: Every day | RECTAL | Status: DC | PRN

## 2016-02-06 MED ORDER — MIDAZOLAM HCL 2 MG/2ML IJ SOLN
INTRAMUSCULAR | Status: AC
  Filled 2016-02-06: qty 2

## 2016-02-06 MED ORDER — ZOLPIDEM TARTRATE 5 MG PO TABS: 5.0000 mg | ORAL_TABLET | Freq: Every evening | ORAL | Status: DC | PRN

## 2016-02-06 MED ORDER — DIBUCAINE 1 % RE OINT: 1.0000 "application " | TOPICAL_OINTMENT | RECTAL | Status: DC | PRN

## 2016-02-06 MED ORDER — FENTANYL CITRATE (PF) 100 MCG/2ML IJ SOLN
INTRAMUSCULAR | Status: AC
  Filled 2016-02-06: qty 2

## 2016-02-06 MED ORDER — LISINOPRIL 20 MG PO TABS
20.0000 mg | ORAL_TABLET | Freq: Every day | ORAL | Status: DC
  Administered 2016-02-06 – 2016-02-07 (×2): 20 mg via ORAL
  Filled 2016-02-06 (×2): qty 1

## 2016-02-06 MED ORDER — WITCH HAZEL-GLYCERIN EX PADS: 1.0000 "application " | MEDICATED_PAD | CUTANEOUS | Status: DC | PRN

## 2016-02-06 MED ORDER — PRENATAL MULTIVITAMIN CH
1.0000 | ORAL_TABLET | Freq: Every day | ORAL | Status: DC
  Administered 2016-02-06 – 2016-02-07 (×2): 1 via ORAL
  Filled 2016-02-06 (×2): qty 1

## 2016-02-06 MED ORDER — FENTANYL CITRATE (PF) 100 MCG/2ML IJ SOLN
INTRAMUSCULAR | Status: DC | PRN
Start: 2016-02-06 — End: 2016-02-07
  Administered 2016-02-06 (×3): 25 ug via INTRAVENOUS

## 2016-02-06 MED ORDER — BENZOCAINE-MENTHOL 20-0.5 % EX AERO: 1.0000 "application " | INHALATION_SPRAY | CUTANEOUS | Status: DC | PRN

## 2016-02-06 MED ORDER — OXYCODONE HCL 5 MG PO TABS
5.0000 mg | ORAL_TABLET | ORAL | Status: DC | PRN
  Administered 2016-02-07: 5 mg via ORAL
  Filled 2016-02-06: qty 1

## 2016-02-06 MED ORDER — DIPHENHYDRAMINE HCL 25 MG PO CAPS: 25.0000 mg | ORAL_CAPSULE | Freq: Four times a day (QID) | ORAL | Status: DC | PRN

## 2016-02-06 MED ORDER — MEASLES, MUMPS & RUBELLA VAC ~~LOC~~ INJ
0.5000 mL | INJECTION | Freq: Once | SUBCUTANEOUS | Status: DC
  Filled 2016-02-06: qty 0.5

## 2016-02-06 MED ORDER — METHYLERGONOVINE MALEATE 0.2 MG PO TABS: 0.2000 mg | ORAL_TABLET | ORAL | Status: DC | PRN

## 2016-02-06 MED ORDER — OXYCODONE HCL 5 MG PO TABS
10.0000 mg | ORAL_TABLET | ORAL | Status: DC | PRN
  Administered 2016-02-06: 10 mg via ORAL
  Filled 2016-02-06: qty 2

## 2016-02-06 MED ORDER — FERROUS SULFATE 325 (65 FE) MG PO TABS
325.0000 mg | ORAL_TABLET | Freq: Two times a day (BID) | ORAL | Status: DC
  Administered 2016-02-06 – 2016-02-07 (×2): 325 mg via ORAL
  Filled 2016-02-06 (×3): qty 1

## 2016-02-06 SURGICAL SUPPLY — 21 items
CLOTH BEACON ORANGE TIMEOUT ST (SAFETY) ×3 IMPLANT
DRSG OPSITE POSTOP 3X4 (GAUZE/BANDAGES/DRESSINGS) ×3 IMPLANT
DURAPREP 26ML APPLICATOR (WOUND CARE) ×3 IMPLANT
GLOVE BIO SURGEON STRL SZ7.5 (GLOVE) ×3 IMPLANT
GLOVE BIOGEL PI IND STRL 7.0 (GLOVE) ×2 IMPLANT
GLOVE BIOGEL PI INDICATOR 7.0 (GLOVE) ×4
GOWN STRL REUS W/TWL LRG LVL3 (GOWN DISPOSABLE) ×3 IMPLANT
GOWN STRL REUS W/TWL XL LVL3 (GOWN DISPOSABLE) ×3 IMPLANT
NEEDLE HYPO 22GX1.5 SAFETY (NEEDLE) ×3 IMPLANT
NS IRRIG 1000ML POUR BTL (IV SOLUTION) ×3 IMPLANT
PACK ABDOMINAL MINOR (CUSTOM PROCEDURE TRAY) ×3 IMPLANT
PROTECTOR NERVE ULNAR (MISCELLANEOUS) ×3 IMPLANT
SPONGE LAP 4X18 X RAY DECT (DISPOSABLE) ×3 IMPLANT
SUT MNCRL AB 4-0 PS2 18 (SUTURE) ×3 IMPLANT
SUT PLAIN 0 NONE (SUTURE) ×3 IMPLANT
SUT VIC AB 0 CT1 27 (SUTURE) ×2
SUT VIC AB 0 CT1 27XBRD ANBCTR (SUTURE) ×1 IMPLANT
SYR CONTROL 10ML LL (SYRINGE) ×3 IMPLANT
TOWEL OR 17X24 6PK STRL BLUE (TOWEL DISPOSABLE) ×6 IMPLANT
TRAY FOLEY CATH SILVER 14FR (SET/KITS/TRAYS/PACK) ×3 IMPLANT
WATER STERILE IRR 1000ML POUR (IV SOLUTION) IMPLANT

## 2016-02-06 NOTE — Progress Notes (Signed)
OB Attending Pt for PPBTL today. Pt has had laparoscopic cholecystectomy o/w no abd surgery. Procedure reviewed with pt, risks and failure rate. Post op care reviewed. Pt verbalized understanding and desires to proceed.

## 2016-02-06 NOTE — Transfer of Care (Signed)
Immediate Anesthesia Transfer of Care Note  Patient: Sherry Jimenez  Procedure(s) Performed: Procedure(s): POST PARTUM TUBAL LIGATION (N/A)  Patient Location: PACU  Anesthesia Type:Epidural  Level of Consciousness: awake and sedated  Airway & Oxygen Therapy: Patient Spontanous Breathing  Post-op Assessment: Report given to RN  Post vital signs: Reviewed and stable  Last Vitals:  Vitals:   02/06/16 1320 02/06/16 1454  BP: 112/71   Pulse: 76 86  Resp: 16 16  Temp: 36.8 C 36.6 C    Last Pain:  Vitals:   02/06/16 1320  TempSrc: Oral  PainSc:          Complications: No apparent anesthesia complications

## 2016-02-06 NOTE — Progress Notes (Signed)
Elevated BP called to Philipp DeputyKim Shaw, CNM. Patient denies blurred vision, headache or epigastric. States has pain at umbilicus incisional area that is unrelieved by po pain med. Patient has a history of chronic hypertension. Providers will review chart, consult and follow up with RN.

## 2016-02-06 NOTE — Anesthesia Postprocedure Evaluation (Signed)
Anesthesia Post Note  Patient: Sherry Jimenez  Procedure(s) Performed: * No procedures listed *  Patient location during evaluation: Mother Baby Anesthesia Type: Epidural Level of consciousness: awake Pain management: pain level controlled Vital Signs Assessment: post-procedure vital signs reviewed and stable Respiratory status: spontaneous breathing Cardiovascular status: stable Postop Assessment: no headache, no backache, epidural receding and patient able to bend at knees Anesthetic complications: no     Last Vitals:  Vitals:   02/05/16 2345 02/06/16 0430  BP: 129/75 121/68  Pulse: 98 82  Resp: (!) 21 20  Temp: 37 C 36.7 C    Last Pain:  Vitals:   02/06/16 0557  TempSrc:   PainSc: 3    Pain Goal:                 Edison PaceWILKERSON,Forney Kleinpeter

## 2016-02-06 NOTE — Clinical Social Work Maternal (Signed)
CLINICAL SOCIAL WORK MATERNAL/CHILD NOTE  Patient Details  Name: Sherry Jimenez MRN: [id number] Date of Birth: 1980/08/06  Date:  02/06/2016  Clinical Social Worker Initiating Note:  Laurey Arrow Date/ Time Initiated:  02/06/16/1414     Child's Name:  Mickle Mallory   Legal Guardian:  Mother   Need for Interpreter:  None   Date of Referral:  02/06/16     Reason for Referral:  Current Substance Use/Substance Use During Pregnancy    Referral Source:  [facility]   Address:  [place] [address]  Phone number:  [phone]   Household Members:  Self, Minor Children   Natural Supports (not living in the home):  Children, Spouse/significant other, Immediate Family, Extended Family   Professional Supports: None   Employment: Full-time   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 MOB was attentive to infant when CSW arrived as evident by MOB engaging in skin to skin.  MOB was inviting, polite, and interested in meeting with CSW.  CSW inquired about MOB's SA hx and MOB denied the use of any substance.  CSW informed MOB of a positive screening for MOB on 07/17/15, and MOB again denied the use of any substance.  MOB communicated that maybe MOB took an edible and did not know it contained THC.  MOB was adamant that MOB does not have a SA hx or used any substance prior to pregnancy confirmation.  CSW informed MOB of the hospital's drug screen policy, and informed MOB of the 2 screenings for the infant. While CSW was in MOB's room, MOB contacted the nursing station requesting to speak with MOB's nurse after MOB changed her infant's diaper. MOB expressed that MOB was not concerned and again was adamant the infant's UDS and cord will have a negative result.  CSW explained to MOB if either screen were positive for the infant, CSW would make a report to [place]; MOB understood. CSW also inquired about MOB's mental health.  MOB denied a hx of anxiety and depression.  MOB reported to CSW that MOB has fibromyalgia and is often prescribed SSRIs to assist with MOB's pain and illness. MOB communicated that often times medical staff mistaken her medications for a mental health concern. MOB also reported that 6 months prior to conceiving this baby, MOB experienced a miscarriage.  MOB reports being sad and feeling overwhelmed but was not prescribed any medication and MOB did not seek any counseling.  CSW offered MOB counseling resources and MOB delcined. CSW and educated MOB about PPD. MOB denied any PPD signs and symptoms with her older 3 girls but appeared knowledgeable. CSW informed MOB of supports and interventions to decrease PPD.  CSW also encouraged MOB to seek medical attention if needed for increased signs or symptoms for PPD. CSW thanked MOB for meeting with CSW and provided MOB with CSW contact  information.  CSW encouraged MOB to contact CSW if MOB had any questions, needs, or concerns.   CSW Plan/Description:  Patient/Family Education , No Further Intervention Required/No Barriers to Discharge (CSW will follow infant's UDS and Cord and will make a report to [place] if needed. )   Laurey Arrow, MSW, LCSW Clinical Social Work [phone]    Dimple Nanas, LCSW 02/06/2016, 2:17 PM

## 2016-02-06 NOTE — Anesthesia Preprocedure Evaluation (Signed)
Anesthesia Evaluation  Patient identified by MRN, date of birth, ID band Patient awake    Reviewed: Allergy & Precautions, Patient's Chart, lab work & pertinent test results  Airway Mallampati: III  TM Distance: >3 FB Neck ROM: Full    Dental no notable dental hx. (+) Teeth Intact   Pulmonary former smoker,    Pulmonary exam normal breath sounds clear to auscultation       Cardiovascular hypertension, Normal cardiovascular exam Rhythm:Regular Rate:Normal  cHTN- on no Rx currently   Neuro/Psych PSYCHIATRIC DISORDERS  Neuromuscular disease    GI/Hepatic   Endo/Other  diabetes, Well Controlled, GestationalMorbid obesity  Renal/GU   negative genitourinary   Musculoskeletal negative musculoskeletal ROS (+) Fibromyalgia -  Abdominal (+) + obese,   Peds  Hematology  (+) anemia ,   Anesthesia Other Findings   Reproductive/Obstetrics                             Lab Results  Component Value Date   WBC 6.4 02/05/2016   HGB 10.8 (L) 02/05/2016   HCT 32.0 (L) 02/05/2016   MCV 80.0 02/05/2016   PLT 219 02/05/2016    Anesthesia Physical  Anesthesia Plan  ASA: III  Anesthesia Plan: Epidural   Post-op Pain Management:    Induction:   Airway Management Planned: Natural Airway  Additional Equipment:   Intra-op Plan:   Post-operative Plan:   Informed Consent: I have reviewed the patients History and Physical, chart, labs and discussed the procedure including the risks, benefits and alternatives for the proposed anesthesia with the patient or authorized representative who has indicated his/her understanding and acceptance.     Plan Discussed with: Anesthesiologist  Anesthesia Plan Comments:         Anesthesia Quick Evaluation

## 2016-02-06 NOTE — Op Note (Signed)
Danford BadShanita S Stockton-Saleh 02/05/2016 - 02/06/2016  PREOPERATIVE DIAGNOSES: Multiparity, undesired fertility  POSTOPERATIVE DIAGNOSES: Multiparity, undesired fertility  PROCEDURE:  Postpartum Bilateral Tubal Sterilization   SURGEON: Dr. Casimiro NeedleMichael L. Jermale Crass  ANESTHESIA:  Epidural and local analgesia using 10 ml of 0.5% Marcaine  COMPLICATIONS:  None immediate.  ESTIMATED BLOOD LOSS: 10 ml.  FLUIDS: 1000 ml LR.  URINE OUTPUT:  As recorded  INDICATIONS:  35 y.o. N5A2130G5P4014 with undesired fertility,status post vaginal delivery, desires permanent sterilization.  Other reversible forms of contraception were discussed with patient; she declines all other modalities. Risks of procedure discussed with patient including but not limited to: risk of regret, permanence of method, bleeding, infection, injury to surrounding organs and need for additional procedures.  Failure risk of 1 -2 % with increased risk of ectopic gestation if pregnancy occurs was also discussed with patient.      FINDINGS:  Normal uterus, tubes, and ovaries.  PROCEDURE DETAILS: The patient was taken to the operating room where her epidural anesthesia was dosed up to surgical level and found to be adequate.  She was then placed in the dorsal supine position and prepped and draped in sterile fashion.  After an adequate timeout was performed, attention was turned to the patient's abdomen where a small transverse skin incision was made under the umbilical fold. The incision was taken down to the layer of fascia using the scalpel, and fascia was incised, and extended bilaterally using Mayo scissors. The peritoneum was entered in a sharp fashion. Attention was then turned to the patient's uterus, and left fallopian tube was identified and followed out to the fimbriated end. A midportion segment was then double ligated with plain gut suture and the knuckle was excised. The procedure was repeated in the same fashion on the opposite side. Good  hemostasis was noted overall.  The instruments were then removed from the patient's abdomen and the fascial incision was repaired with 0 Vicryl, and the skin was closed with a 4-0 Vicryl subcuticular stitch. The patient tolerated the procedure well.  Instrument, sponge, and needle counts were correct times two.  The patient was then taken to the recovery room awake and in stable condition.   Hermina StaggersMichael L Jessilyn Catino, MD, FACOG Attending Obstetrician & Gynecologist Faculty Practice, [facility]

## 2016-02-06 NOTE — Progress Notes (Signed)
Post Partum Day 1 Subjective: no complaints, up ad lib, voiding and tolerating PO, small lochia, plans to breastfeed, bilateral tubal ligation @ 1100 today  Objective: Blood pressure 121/68, pulse 82, temperature 98 F (36.7 C), temperature source Oral, resp. rate 20, height 5\' 2"  (1.575 m), weight 116.1 kg (256 lb), last menstrual period 05/08/2015, SpO2 100 %, unknown if currently breastfeeding.  Physical Exam:  General: alert, cooperative and no distress Lochia:normal flow Chest: CTAB Heart: RRR no m/r/g Abdomen: +BS, soft, nontender,  Uterine Fundus: firm DVT Evaluation: No evidence of DVT seen on physical exam. Extremities: trace edema   Recent Labs  02/05/16 0100 02/05/16 0940  HGB 10.8* 10.8*  HCT 31.4* 32.0*    Assessment/Plan: Plan for discharge tomorrow, Breastfeeding and Lactation consult   LOS: 1 day   CRESENZO-DISHMAN,Jaice Lague 02/06/2016, 8:07 AM

## 2016-02-07 LAB — BIRTH TISSUE RECOVERY COLLECTION (PLACENTA DONATION)

## 2016-02-07 MED ORDER — LISINOPRIL 20 MG PO TABS: 20.0000 mg | ORAL_TABLET | Freq: Every day | ORAL | 2 refills | Status: DC

## 2016-02-07 MED ORDER — ACETAMINOPHEN 325 MG PO TABS: 650.0000 mg | ORAL_TABLET | ORAL | Status: DC | PRN

## 2016-02-07 MED ORDER — HYDROMORPHONE HCL 2 MG PO TABS: 2.0000 mg | ORAL_TABLET | ORAL | 0 refills | Status: DC | PRN

## 2016-02-07 MED ORDER — ZOLPIDEM TARTRATE 5 MG PO TABS: 5.0000 mg | ORAL_TABLET | Freq: Every evening | ORAL | 0 refills | Status: DC | PRN

## 2016-02-07 MED ORDER — FERROUS SULFATE 325 (65 FE) MG PO TABS: 325.0000 mg | ORAL_TABLET | Freq: Two times a day (BID) | ORAL | 3 refills | Status: DC

## 2016-02-07 NOTE — Discharge Summary (Signed)
  OB Discharge Summary     Patient Name: Sherry Jimenez DOB: 07/19/1980 MRN: [id number]  Date of admission: 02/05/2016 Delivering MD: YOO, ELSIA J   Date of discharge: 02/07/2016  Admitting diagnosis: INDUCTION desires sterilization  Intrauterine pregnancy: [redacted]w[redacted]d     Secondary diagnosis:  Active Problems:   Chronic hypertension in pregnancy  Additional problems: GDMA2     Discharge diagnosis: Term Pregnancy Delivered, CHTN and GDM A2                                                                                                Post partum procedures:postpartum tubal ligation  Augmentation: AROM, Pitocin, Cytotec and Foley Balloon  Complications: None  Hospital course:  Induction of Labor With Vaginal Delivery   35 y.o. yo G5P4014 at [redacted]w[redacted]d was admitted to the hospital 02/05/2016 for induction of labor.  Indication for induction: A2 DM and cHTN.  Patient had an uncomplicated labor course as follows: Membrane Rupture Time/Date: 4:08 PM ,02/05/2016   Intrapartum Procedures: Episiotomy: None [1]                                         Lacerations:  None [1]  Patient had delivery of a Viable infant.  Information for the patient's newborn:  Hylton, Girl Yilia [[id number]]      02/05/2016  Details of delivery can be found in separate delivery note.  Patient had a routine postpartum course. Lisinopril 20mg was added on PPD#1 due to elevated BPs (pt had been on Lisinopril 40mg prepreg). Patient is discharged home 02/07/16. BPs improved over night.   Physical exam Vitals:   02/06/16 1910 02/06/16 2155 02/07/16 0138 02/07/16 0518  BP: (!) 153/84 (!) 141/86 130/79 118/67  Pulse: 84 72 68 75  Resp: 18 18 18 18  Temp: 98.1 F (36.7 C) 97.6 F (36.4 C) 98.7 F (37.1 C) 97.8 F (36.6 C)  TempSrc: Oral Oral Oral Oral  SpO2:      Weight:      Height:       General: alert and cooperative Lochia: appropriate Uterine Fundus: firm Incision: Dressing is clean, dry, and  intact DVT Evaluation: No evidence of DVT seen on physical exam. Labs: Lab Results  Component Value Date   WBC 6.4 02/05/2016   HGB 10.8 (L) 02/05/2016   HCT 32.0 (L) 02/05/2016   MCV 80.0 02/05/2016   PLT 219 02/05/2016   CMP Latest Ref Rng & Units 02/05/2016  Glucose 65 - 99 mg/dL 98  BUN 6 - 20 mg/dL -  Creatinine 0.44 - 1.00 mg/dL -  Sodium 135 - 145 mmol/L -  Potassium 3.5 - 5.1 mmol/L -  Chloride 101 - 111 mmol/L -  CO2 22 - 32 mmol/L -  Calcium 8.9 - 10.3 mg/dL -  Total Protein 6.5 - 8.1 g/dL -  Total Bilirubin 0.3 - 1.2 mg/dL -  Alkaline Phos 38 - 126 U/L -  AST 15 - 41 U/L -  ALT 14 - 54 U/L -      Discharge instruction: per After Visit Summary and "Baby and Me Booklet".  After visit meds:    Medication List    STOP taking these medications   aspirin EC 81 MG tablet   blood glucose meter kit and supplies   glucose blood test strip   glyBURIDE 5 MG tablet Commonly known as:  DIABETA   onetouch ultrasoft lancets     TAKE these medications   acetaminophen 325 MG tablet Commonly known as:  TYLENOL Take 2 tablets (650 mg total) by mouth every 4 (four) hours as needed (for pain scale < 4).   cetirizine 10 MG tablet Commonly known as:  ZYRTEC Take by mouth.   cyclobenzaprine 5 MG tablet Commonly known as:  FLEXERIL   ferrous sulfate 325 (65 FE) MG tablet Take 1 tablet (325 mg total) by mouth 2 (two) times daily with a meal.   fluticasone 50 MCG/ACT nasal spray Commonly known as:  FLONASE Place 2 sprays into both nostrils daily.   HYDROmorphone 2 MG tablet Commonly known as:  DILAUDID Take 1 tablet (2 mg total) by mouth every 4 (four) hours as needed for moderate pain or severe pain.   lisinopril 20 MG tablet Commonly known as:  PRINIVIL,ZESTRIL Take 1 tablet (20 mg total) by mouth daily.   Prenatal Vitamins 0.8 MG tablet Take 1 tablet by mouth daily.   zolpidem 10 MG tablet Commonly known as:  AMBIEN Take 1/2 to 1 tablet at night for  sleep What changed:  Another medication with the same name was added. Make sure you understand how and when to take each.   zolpidem 5 MG tablet Commonly known as:  AMBIEN Take 1 tablet (5 mg total) by mouth at bedtime as needed for sleep. What changed:  You were already taking a medication with the same name, and this prescription was added. Make sure you understand how and when to take each.       Diet: routine diet  Activity: Advance as tolerated. Pelvic rest for 6 weeks.   Outpatient follow up:6 weeks; needs 2hr glucola at [facility] visit Follow up Appt:Future Appointments Date Time Provider [place]  03/01/2016 3:40 PM Waldemar Dickens, MD [place] [facility]   Follow up Visit:No Follow-up on file.  Postpartum contraception: Tubal Ligation (s/p)  Newborn Data: Live born female  Birth Weight: 8 lb (3629 g) APGAR: 7, 9  Baby Feeding: Bottle Disposition:home with mother   02/07/2016 Serita Grammes, CNM  9:17 AM

## 2016-02-07 NOTE — Discharge Instructions (Signed)
Postpartum Tubal Ligation, Care After °Refer to this sheet in the next few weeks. These instructions provide you with information about caring for yourself after your procedure. Your health care provider may also give you more specific instructions. Your treatment has been planned according to current medical practices, but problems sometimes occur. Call your health care provider if you have any problems or questions after your procedure. °WHAT TO EXPECT AFTER THE PROCEDURE °After your procedure, it is common to have: °· Sore throat. °· Soreness at the incision site. °· Mild cramping. °· Tiredness. °· Mild nausea or vomiting. °HOME CARE INSTRUCTIONS °· Rest for the remainder of the day. °· Take medicines only as directed by your health care provider. These include over-the-counter medicines and prescription medicines. Do not take aspirin, which can cause bleeding. °· Over the next few days, gradually return to your normal activities and your normal diet. °· Avoid sexual intercourse for 2 weeks or as directed by your health care provider. °· Do not drive or operate heavy machinery while taking pain medicine. °· Do not lift anything that is heavier than 5 lb (2.3 kg) for 2 weeks or as directed by your health care provider. °· Do not take baths. Take showers only. Ask your health care provider when you can start taking baths. °· Take your temperature twice each day and write it down. °· Try to have help for the first 7-10 days for your household needs. °· There are many different ways to close and cover an incision, including stitches (sutures), skin glue, and adhesive strips. Follow instructions from your health care provider about: °¨ Incision care. °¨ Bandage (dressing) changes and removal. °¨ Incision closure removal. °· Check your incision area every day for signs of infection. Watch for: °¨ Redness, swelling, or pain. °¨ Fluid, blood, or pus. °· Keep all follow-up visits as directed by your health care  provider. °SEEK MEDICAL CARE IF: °· You have redness, swelling, or increasing pain in your incision area. °· You have fluid or pus coming from your incision for longer than 1 day. °· You notice a bad smell coming from your incision or your dressing. °· The edges of your incision break open after the sutures have been removed. °· Your pain does not decrease after 2-3 days. °· You have a rash. °· You repeatedly become dizzy or light-headed. °· You have a reaction to your medicine. °· Your pain medicine is not helping. °· You are constipated. °SEEK IMMEDIATE MEDICAL CARE IF:  °· You have a fever. °· You faint. °· You have increasing pain in your abdomen. °· You have bleeding or drainage from your suture sites or your vagina after surgery. °· You have shortness of breath or have difficulty breathing. °· You have chest pain or leg pain. °· You have ongoing nausea, vomiting, or diarrhea. °  °This information is not intended to replace advice given to you by your health care provider. Make sure you discuss any questions you have with your health care provider. °  °Document Released: 10/26/2011 Document Revised: 09/10/2014 Document Reviewed: 10/26/2011 °Elsevier Interactive Patient Education ©2016 Elsevier Inc[employer] °Postpartum Care After Vaginal Delivery °After you deliver your newborn (postpartum period), the usual stay in the hospital is 24-72 hours. If there were problems with your labor or delivery, or if you have other medical problems, you might be in the hospital longer.  °While you are in the hospital, you will receive help and instructions on how to care for yourself and your   newborn during the postpartum period.  °While you are in the hospital: °· Be sure to tell your nurses if you have pain or discomfort, as well as where you feel the pain and what makes the pain worse. °· If you had an incision made near your vagina (episiotomy) or if you had some tearing during delivery, the nurses may put ice packs on your  episiotomy or tear. The ice packs may help to reduce the pain and swelling. °· If you are breastfeeding, you may feel uncomfortable contractions of your uterus for a couple of weeks. This is normal. The contractions help your uterus get back to normal size. °· It is normal to have some bleeding after delivery. °¨ For the first 1-3 days after delivery, the flow is red and the amount may be similar to a period. °¨ It is common for the flow to start and stop. °¨ In the first few days, you may pass some small clots. Let your nurses know if you begin to pass large clots or your flow increases. °¨ Do not  flush blood clots down the toilet before having the nurse look at them. °¨ During the next 3-10 days after delivery, your flow should become more watery and pink or brown-tinged in color. °¨ Ten to fourteen days after delivery, your flow should be a small amount of yellowish-white discharge. °¨ The amount of your flow will decrease over the first few weeks after delivery. Your flow may stop in 6-8 weeks. Most women have had their flow stop by 12 weeks after delivery. °· You should change your sanitary pads frequently. °· Wash your hands thoroughly with soap and water for at least 20 seconds after changing pads, using the toilet, or before holding or feeding your newborn. °· You should feel like you need to empty your bladder within the first 6-8 hours after delivery. °· In case you become weak, lightheaded, or faint, call your nurse before you get out of bed for the first time and before you take a shower for the first time. °· Within the first few days after delivery, your breasts may begin to feel tender and full. This is called engorgement. Breast tenderness usually goes away within 48-72 hours after engorgement occurs. You may also notice milk leaking from your breasts. If you are not breastfeeding, do not stimulate your breasts. Breast stimulation can make your breasts produce more milk. °· Spending as much time as  possible with your newborn is very important. During this time, you and your newborn can feel close and get to know each other. Having your newborn stay in your room (rooming in) will help to strengthen the bond with your newborn.  It will give you time to get to know your newborn and become comfortable caring for your newborn. °· Your hormones change after delivery. Sometimes the hormone changes can temporarily cause you to feel sad or tearful. These feelings should not last more than a few days. If these feelings last longer than that, you should talk to your caregiver. °· If desired, talk to your caregiver about methods of family planning or contraception. °· Talk to your caregiver about immunizations. Your caregiver may want you to have the following immunizations before leaving the hospital: °¨ Tetanus, diphtheria, and pertussis (Tdap) or tetanus and diphtheria (Td) immunization. It is very important that you and your family (including grandparents) or others caring for your newborn are up-to-date with the Tdap or Td immunizations. The Tdap or Td immunization can   help protect your newborn from getting ill. °¨ Rubella immunization. °¨ Varicella (chickenpox) immunization. °¨ Influenza immunization. You should receive this annual immunization if you did not receive the immunization during your pregnancy. °  °This information is not intended to replace advice given to you by your health care provider. Make sure you discuss any questions you have with your health care provider. °  °Document Released: 02/21/2007 Document Revised: 01/19/2012 Document Reviewed: 12/22/2011 °Elsevier Interactive Patient Education ©2016 Elsevier Inc. ° °

## 2016-02-07 NOTE — Progress Notes (Signed)
Patient discharge instruction, follow up information, and post partum care information provided. Patient verbalized understanding with teach back and has no questions. Patient stated she would take care of finding out which local pharmacy that her husband can take prescriptions to for filling. Baby patient only now.

## 2016-02-07 NOTE — Anesthesia Postprocedure Evaluation (Signed)
Anesthesia Post Note  Patient: Sherry Jimenez  Procedure(s) Performed: Procedure(s) (LRB): POST PARTUM TUBAL LIGATION (N/A)  Patient location during evaluation: Mother Baby Anesthesia Type: Epidural Level of consciousness: awake Pain management: satisfactory to patient Vital Signs Assessment: post-procedure vital signs reviewed and stable Respiratory status: spontaneous breathing Cardiovascular status: stable Anesthetic complications: no     Last Vitals:  Vitals:   02/07/16 0138 02/07/16 0518  BP: 130/79 118/67  Pulse: 68 75  Resp: 18 18  Temp: 37.1 C 36.6 C    Last Pain:  Vitals:   02/07/16 0518  TempSrc: Oral  PainSc:    Pain Goal: Patients Stated Pain Goal: 3 (02/06/16 1730)               Cephus ShellingBURGER,Jaaliyah Lucatero

## 2016-02-09 ENCOUNTER — Other Ambulatory Visit: Payer: BC Managed Care – PPO | Admitting: Family Medicine

## 2016-02-09 ENCOUNTER — Encounter (HOSPITAL_COMMUNITY): Payer: Self-pay | Admitting: Obstetrics and Gynecology

## 2016-02-11 ENCOUNTER — Other Ambulatory Visit: Payer: Self-pay | Admitting: *Deleted

## 2016-02-12 ENCOUNTER — Other Ambulatory Visit: Payer: BC Managed Care – PPO

## 2016-02-12 MED ORDER — PREGABALIN 75 MG PO CAPS: 75.0000 mg | ORAL_CAPSULE | Freq: Two times a day (BID) | ORAL | 0 refills | Status: DC

## 2016-02-16 ENCOUNTER — Telehealth: Payer: Self-pay | Admitting: *Deleted

## 2016-02-16 ENCOUNTER — Encounter: Payer: Self-pay | Admitting: *Deleted

## 2016-02-16 NOTE — Telephone Encounter (Signed)
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  States she doesn't lift much.  I informed her I would discuss with a provider and call her back.    I called Dr. Alysia PennaErvin and reviewed her surgery date 02/06/16 and her request. He approved her to receive a letter that she may return to work.    I called Sherry Jimenez and notified her she can pick up the letter today or tomorrow and that we still want her to keep her postpartum appt which I reviewed with her. She voices understanding.

## 2016-02-16 NOTE — Telephone Encounter (Signed)
Sherry Jimenez called today and left a message she needs a note to return to work.  States she knows it is a certain number of weeks after surgery but states she only works 3 hours a day. States she wants to return to work tomorrow or find out how soon she can return to work.

## 2016-03-01 ENCOUNTER — Ambulatory Visit: Payer: BC Managed Care – PPO | Admitting: Obstetrics and Gynecology

## 2016-03-08 ENCOUNTER — Other Ambulatory Visit: Payer: Self-pay | Admitting: Family Medicine

## 2016-03-12 NOTE — Telephone Encounter (Signed)
Patient is aware that script is ready but would like it called in due to not being able to make it to clinic to pick up by closing today.  [facility] [place] and also appt made for 04/05/16. Jazmin Hartsell,CMA

## 2016-03-19 ENCOUNTER — Other Ambulatory Visit: Payer: Self-pay | Admitting: *Deleted

## 2016-03-19 MED ORDER — PREGABALIN 75 MG PO CAPS: 75.0000 mg | ORAL_CAPSULE | Freq: Two times a day (BID) | ORAL | 0 refills | Status: DC

## 2016-03-23 ENCOUNTER — Telehealth: Payer: Self-pay | Admitting: Family Medicine

## 2016-03-23 ENCOUNTER — Encounter: Payer: Self-pay | Admitting: Certified Nurse Midwife

## 2016-03-23 ENCOUNTER — Ambulatory Visit (INDEPENDENT_AMBULATORY_CARE_PROVIDER_SITE_OTHER): Payer: BC Managed Care – PPO | Admitting: Certified Nurse Midwife

## 2016-03-23 DIAGNOSIS — I10 Essential (primary) hypertension: Secondary | ICD-10-CM

## 2016-03-23 NOTE — Progress Notes (Signed)
Pt reports having a cough.  

## 2016-03-23 NOTE — Progress Notes (Signed)
Subjective:     Sherry Jimenez is a 35 y.o. female who presents for a postpartum visit. She is 7 weeks postpartum following a spontaneous vaginal delivery. I have fully reviewed the prenatal and intrapartum course. The delivery was at 39 gestational weeks. Outcome: spontaneous vaginal delivery. Anesthesia: epidural. Postpartum course has been uncomplicated. Baby's course has been uncomplicated. Baby is feeding by bottle - Similac Advance. Bleeding no bleeding. Bowel function is normal. Bladder function is normal. Patient is not sexually active. Contraception method is tubal ligation. Postpartum depression screening: negative.  The following portions of the patient's history were reviewed and updated as appropriate: allergies, current medications, past family history, past medical history, past social history, past surgical history and problem list.  Review of Systems Pertinent items are noted in HPI.   Objective:    BP (!) 151/86   Pulse 68   LMP 03/18/2016   Breastfeeding? No   General:  alert, cooperative and no distress   Breasts:  not examined  Lungs: nml effort and rate  Heart:  normal rate          Assessment:    Normal postpartum exam.   Pap smear not done at today's visit.   Chronic HTN  A2GDM, delivered Plan:    1. Contraception: tubal ligation  2. GTT in 1-2 weeks  3. Increase Lisinopril to 40 mg daily  4. Follow up with PCP in 2 weeks as scheduled  5. Follow up in [facility] as needed

## 2016-03-23 NOTE — Telephone Encounter (Signed)
Tramadol was called in on 03-12-16 for 30 pills. She is taking it 3 times per day so this is only for 10 days.  She needs another prescription sent in for the correct amt. She doesn't know where the note "pt not taking came from". She also needs a refill on her lyrica.  [facility] [place]

## 2016-03-24 MED ORDER — TRAMADOL HCL 50 MG PO TABS: 50.0000 mg | ORAL_TABLET | Freq: Three times a day (TID) | ORAL | 0 refills | Status: DC | PRN

## 2016-03-24 MED ORDER — PREGABALIN 75 MG PO CAPS: 75.0000 mg | ORAL_CAPSULE | Freq: Two times a day (BID) | ORAL | 0 refills | Status: DC

## 2016-04-05 ENCOUNTER — Encounter: Payer: Self-pay | Admitting: Family Medicine

## 2016-04-05 ENCOUNTER — Ambulatory Visit (INDEPENDENT_AMBULATORY_CARE_PROVIDER_SITE_OTHER): Payer: BC Managed Care – PPO | Admitting: Family Medicine

## 2016-04-05 VITALS — BP 150/93 | HR 99 | Ht 62.0 in | Wt 257.0 lb

## 2016-04-05 DIAGNOSIS — M797 Fibromyalgia: Secondary | ICD-10-CM | POA: Diagnosis not present

## 2016-04-05 DIAGNOSIS — I10 Essential (primary) hypertension: Secondary | ICD-10-CM

## 2016-04-05 DIAGNOSIS — Z760 Encounter for issue of repeat prescription: Secondary | ICD-10-CM

## 2016-04-05 MED ORDER — NORTRIPTYLINE HCL 50 MG PO CAPS: 50.0000 mg | ORAL_CAPSULE | Freq: Every day | ORAL | 1 refills | Status: DC

## 2016-04-05 MED ORDER — HYDROCHLOROTHIAZIDE 25 MG PO TABS: 25.0000 mg | ORAL_TABLET | Freq: Every day | ORAL | 2 refills | Status: DC

## 2016-04-05 MED ORDER — TRAMADOL HCL 50 MG PO TABS: 50.0000 mg | ORAL_TABLET | Freq: Three times a day (TID) | ORAL | 0 refills | Status: DC | PRN

## 2016-04-05 NOTE — Progress Notes (Signed)
   Subjective:    Patient ID: Sherry Jimenez, female    DOB: 12/13/1980, 35 y.o.   MRN: [id number]   CC: Meeting MD and discuss medication regimen  HPI: Sherry Jimenez is a 35 yo female with past medical history significant for hypertension, gestational diabetes, fibromyalgia and obesity who present today to discuss her current medication regimen. Patient has been taking Tramadol for three times a day for generalized pain with her history of fibromyalgia. Patient reports that Lyrica is "not working for her anymore". Patient also reports leg swelling and headaches that she attributes to elevated blood pressure. According to patient, she has discussed in the past with Dr. Gayla DossJoyner starting a diuretic. Patient also reports a stressful home life, with 7 kids including a newborn and a husband that is home only once a week because of his job.   Smoking status reviewed   ROS: all other systems were reviewed and are negative other than in the HPI   Past medical history, surgical, family, and social history reviewed and updated in the EMR as appropriate.  Objective:  BP (!) 150/93   Pulse 99   Ht 5\' 2"  (1.575 m)   Wt 257 lb (116.6 kg)   LMP 03/18/2016   Breastfeeding? No   BMI 47.01 kg/m   Vitals and nursing note reviewed  General: NAD, pleasant, able to participate in exam Cardiac: RRR, normal heart sounds, no murmurs. 2+ radial and PT pulses bilaterally Respiratory: CTAB, normal effort, No wheezes, rales or rhonchi Abdomen: soft, nontender, nondistended, no hepatic or splenomegaly, +BS Extremities: no edema or cyanosis. WWP. Skin: warm and dry, no rashes noted Neuro: alert and oriented x4, no focal deficits Psych: Normal affect and mood   Assessment & Plan:   #Fibromyalgia, chronic Patient has a long history of generalized pain mostly located to her back and lower extremities bilaterally. Patient has been taking tramadol endorses pain relief from tramadol but did not feel  any benefit from Lyrica. I discussed with patient the danger of taking tramadol as scheduled and have decided to work on changing frequency. --Refill tramadol 50 mg (60 pills) as needed from TID ( previous regimen) --Start Nortriptyline 50 mg one a day  --Stop taking Lyrica   #Hypertension, uncontrolled Patient was on 20 mg lisinopril with a recent increase to 40 mg. Today BP was  150/93, and patient endorses frequent headaches that she attributing to her blood pressure. Patient with a history of intermittent lower extremities swelling. Previously, discussed starting diuretic with previous provider. Will start patient on hydrochlorothiazide and reevaluate at next visit. --Start HCTZ 25 mg a day --Continue lisinopril 40 mg daily   Lovena NeighboursAbdoulaye Doxie Augenstein, MD Family Medicine Resident PGY-1

## 2016-04-05 NOTE — Patient Instructions (Signed)
It was great seeing you today! We have addressed the following issues today  1. I'm starting you on hydrochlorothiazide (HCTZ) for your blood pressure 25 mg take it once a day. 2. You will stop taking the Lyrica and will start you on the nortriptyline 50 mg take at night.  3. I refilled your tramadol, as we discuss I would like you to try to take it less often. We will reevaluate at our next visit. 4. I will see in about a month   If we did any lab work today, and the results require attention, either me or my nurse will get in touch with you. If everything is normal, you will get a letter in mail. If you don't hear from us in two weeks, please give us a call. Otherwise, we look forward to seeing you again at your next visit. If you have any questions or concerns before then, please call the clinic at [phone].   Please bring all your medications to every doctors visit   Sign up for My Chart to have easy access to your labs results, and communication with your Primary care physician.     Please check-out at the front desk before leaving the clinic.    Take Care,

## 2016-04-21 ENCOUNTER — Other Ambulatory Visit: Payer: Self-pay | Admitting: Family Medicine

## 2016-04-21 DIAGNOSIS — M797 Fibromyalgia: Secondary | ICD-10-CM

## 2016-04-22 ENCOUNTER — Other Ambulatory Visit: Payer: Self-pay | Admitting: *Deleted

## 2016-04-22 DIAGNOSIS — M797 Fibromyalgia: Secondary | ICD-10-CM

## 2016-04-22 MED ORDER — TRAMADOL HCL 50 MG PO TABS: 50.0000 mg | ORAL_TABLET | Freq: Three times a day (TID) | ORAL | 0 refills | Status: DC | PRN

## 2016-04-23 NOTE — Telephone Encounter (Signed)
Medication called into pharmacy and patient made aware of this. Carver Murakami,CMA

## 2016-04-23 NOTE — Telephone Encounter (Signed)
Left message informing pt her rx was ready for pick up at our office.

## 2016-04-23 NOTE — Telephone Encounter (Signed)
Received another refill for Tramadol.  Rx was approved on 04/21/16 and stated print. Please advise.  Clovis PuMartin, Olufemi Mofield L, RN

## 2016-04-23 NOTE — Telephone Encounter (Signed)
[facility] [place] [facility], because pt does not want to drive an hour to pick up Rx. Please advise. Thanks! ep

## 2016-04-23 NOTE — Telephone Encounter (Signed)
[facility]  Clovis PuMartin, Nevin Grizzle L, RN

## 2016-05-06 ENCOUNTER — Ambulatory Visit: Payer: BC Managed Care – PPO | Admitting: Family Medicine

## 2016-05-21 ENCOUNTER — Other Ambulatory Visit: Payer: Self-pay | Admitting: *Deleted

## 2016-05-21 DIAGNOSIS — M797 Fibromyalgia: Secondary | ICD-10-CM

## 2016-05-21 MED ORDER — PREGABALIN 75 MG PO CAPS: 75.0000 mg | ORAL_CAPSULE | Freq: Two times a day (BID) | ORAL | 0 refills | Status: DC

## 2016-05-21 MED ORDER — TRAMADOL HCL 50 MG PO TABS: 50.0000 mg | ORAL_TABLET | Freq: Three times a day (TID) | ORAL | 0 refills | Status: DC | PRN

## 2016-05-25 ENCOUNTER — Telehealth: Payer: Self-pay | Admitting: Family Medicine

## 2016-05-25 NOTE — Telephone Encounter (Signed)
[facility]amad[facility]rica [id number] [phone]) Pharmacist, Onalee Huaavid.  Please see refill from 05/21/2016 L. Leward Quanucatte, RN, BSN

## 2016-05-25 NOTE — Telephone Encounter (Signed)
Record shows lyrica and tramadol werer called in 05-21-16 but the pharmacy says they dont have it. Please verify and call pt back

## 2016-05-25 NOTE — Telephone Encounter (Signed)
Will forward to RN to see if this was called in for patient. Jazmin Hartsell,CMA

## 2016-06-20 ENCOUNTER — Other Ambulatory Visit: Payer: Self-pay | Admitting: Advanced Practice Midwife

## 2016-06-20 ENCOUNTER — Other Ambulatory Visit: Payer: Self-pay | Admitting: Family Medicine

## 2016-06-20 DIAGNOSIS — M797 Fibromyalgia: Secondary | ICD-10-CM

## 2016-06-20 DIAGNOSIS — M549 Dorsalgia, unspecified: Secondary | ICD-10-CM

## 2016-06-20 DIAGNOSIS — O9989 Other specified diseases and conditions complicating pregnancy, childbirth and the puerperium: Principal | ICD-10-CM

## 2016-06-20 DIAGNOSIS — O99891 Other specified diseases and conditions complicating pregnancy: Secondary | ICD-10-CM

## 2016-06-21 ENCOUNTER — Other Ambulatory Visit: Payer: Self-pay | Admitting: Family Medicine

## 2016-07-01 ENCOUNTER — Other Ambulatory Visit: Payer: Self-pay | Admitting: Family Medicine

## 2016-07-01 DIAGNOSIS — I10 Essential (primary) hypertension: Secondary | ICD-10-CM

## 2016-07-07 ENCOUNTER — Other Ambulatory Visit: Payer: Self-pay | Admitting: Family Medicine

## 2016-07-07 MED ORDER — LISINOPRIL 20 MG PO TABS: 20.0000 mg | ORAL_TABLET | Freq: Every day | ORAL | 2 refills | Status: DC

## 2016-07-07 NOTE — Telephone Encounter (Signed)
Needs refill on lisonpril.  She was given it in sept for 6 months but the dosage was increased so she was taking 2 pills a day instead of one.  Therefore she has run out of them.  [facility]

## 2016-07-19 ENCOUNTER — Other Ambulatory Visit: Payer: Self-pay | Admitting: Family Medicine

## 2016-07-19 ENCOUNTER — Other Ambulatory Visit: Payer: Self-pay | Admitting: Advanced Practice Midwife

## 2016-07-19 DIAGNOSIS — M549 Dorsalgia, unspecified: Secondary | ICD-10-CM

## 2016-07-19 DIAGNOSIS — O9989 Other specified diseases and conditions complicating pregnancy, childbirth and the puerperium: Principal | ICD-10-CM

## 2016-07-19 DIAGNOSIS — M797 Fibromyalgia: Secondary | ICD-10-CM

## 2016-07-19 DIAGNOSIS — O99891 Other specified diseases and conditions complicating pregnancy: Secondary | ICD-10-CM

## 2016-07-22 MED ORDER — TRAMADOL HCL 50 MG PO TABS: 50.0000 mg | ORAL_TABLET | Freq: Two times a day (BID) | ORAL | 0 refills | Status: DC

## 2016-07-22 NOTE — Telephone Encounter (Signed)
4th request.  Martin, Tamika L, RN  

## 2016-07-22 NOTE — Telephone Encounter (Signed)
Pt would like a refill on tramadol. ep °

## 2016-07-23 ENCOUNTER — Encounter: Payer: Self-pay | Admitting: *Deleted

## 2016-07-30 ENCOUNTER — Other Ambulatory Visit: Payer: Self-pay | Admitting: *Deleted

## 2016-08-03 ENCOUNTER — Ambulatory Visit: Payer: BC Managed Care – PPO | Admitting: Family Medicine

## 2016-08-05 ENCOUNTER — Ambulatory Visit (INDEPENDENT_AMBULATORY_CARE_PROVIDER_SITE_OTHER): Payer: BC Managed Care – PPO | Admitting: Student

## 2016-08-05 ENCOUNTER — Encounter: Payer: Self-pay | Admitting: Student

## 2016-08-05 DIAGNOSIS — R059 Cough, unspecified: Secondary | ICD-10-CM

## 2016-08-05 DIAGNOSIS — M797 Fibromyalgia: Secondary | ICD-10-CM | POA: Diagnosis not present

## 2016-08-05 DIAGNOSIS — R05 Cough: Secondary | ICD-10-CM | POA: Diagnosis not present

## 2016-08-05 DIAGNOSIS — I10 Essential (primary) hypertension: Secondary | ICD-10-CM

## 2016-08-05 MED ORDER — PREGABALIN 75 MG PO CAPS: 75.0000 mg | ORAL_CAPSULE | Freq: Two times a day (BID) | ORAL | 0 refills | Status: DC

## 2016-08-05 MED ORDER — LISINOPRIL 40 MG PO TABS: 40.0000 mg | ORAL_TABLET | Freq: Every day | ORAL | 3 refills | Status: DC

## 2016-08-05 MED ORDER — LEVOCETIRIZINE DIHYDROCHLORIDE 5 MG PO TABS: 5.0000 mg | ORAL_TABLET | Freq: Every evening | ORAL | 2 refills | Status: DC

## 2016-08-05 NOTE — Assessment & Plan Note (Signed)
BP at goal. Lisinopril 40 refilled.

## 2016-08-05 NOTE — Assessment & Plan Note (Signed)
Lyrica refilled

## 2016-08-05 NOTE — Assessment & Plan Note (Signed)
Cough in the setting of seasonal allergies. No infectious symptoms. Cough likely due to allergies - will prescribe xyzal

## 2016-08-05 NOTE — Progress Notes (Signed)
   Subjective:    Patient ID: Sherry Jimenez, female    DOB: 06/16/1980, 36 y.o.   MRN: [id number]   CC: cough, Med refill  HPI: 36 y/o F presents for cough and med refill  Cough - productive of clear sputum - has been present for one month - she does have a history of allergies and she has tried various OTC cold remedies which have not been helpful - she denies fevers, SOB, sore throat  HTN - previously on lisinopril 40 and has been ordered for 20 but she has been taking two tabs - she is now running out - she denies chest pain, SOB  Fibromyalgia - requests lyrica refill - last refilled over one month ago  Smoking status reviewed  Review of Systems  Per HPI   Objective:  BP 128/60   Pulse 72   Temp 98.6 F (37 C) (Oral)   Ht 5\' 2"  (1.575 m)   Wt 261 lb (118.4 kg)   LMP  (LMP Unknown)   BMI 47.74 kg/m  Vitals and nursing note reviewed  General: NAD Cardiac: RRR, Respiratory: CTAB, normal effort Skin: warm and dry, no rashes noted Neuro: alert and oriented, no focal deficits   Assessment & Plan:    Cough Cough in the setting of seasonal allergies. No infectious symptoms. Cough likely due to allergies - will prescribe xyzal  Essential hypertension, benign BP at goal. Lisinopril 40 refilled.   Fibromyalgia Lyrica refilled    Sherry Lapiana A. Kennon RoundsHaney MD, MS Family Medicine Resident PGY-3 Pager [phone]

## 2016-08-05 NOTE — Patient Instructions (Signed)
Follow up as needed with PCP Call the office with questions or concerns

## 2016-08-19 ENCOUNTER — Other Ambulatory Visit: Payer: Self-pay | Admitting: Family Medicine

## 2016-08-19 DIAGNOSIS — M797 Fibromyalgia: Secondary | ICD-10-CM

## 2016-08-20 ENCOUNTER — Other Ambulatory Visit: Payer: Self-pay | Admitting: *Deleted

## 2016-08-20 DIAGNOSIS — M797 Fibromyalgia: Secondary | ICD-10-CM

## 2016-08-20 MED ORDER — TRAMADOL HCL 50 MG PO TABS: 50.0000 mg | ORAL_TABLET | ORAL | 0 refills | Status: DC | PRN

## 2016-08-20 NOTE — Telephone Encounter (Signed)
[facility]. Note routed to PCP. Kinnie Feil, RN, BSN

## 2016-09-06 ENCOUNTER — Other Ambulatory Visit: Payer: Self-pay | Admitting: *Deleted

## 2016-09-13 ENCOUNTER — Other Ambulatory Visit: Payer: Self-pay | Admitting: *Deleted

## 2016-09-13 DIAGNOSIS — M797 Fibromyalgia: Secondary | ICD-10-CM

## 2016-09-15 ENCOUNTER — Other Ambulatory Visit: Payer: Self-pay | Admitting: *Deleted

## 2016-09-15 DIAGNOSIS — M797 Fibromyalgia: Secondary | ICD-10-CM

## 2016-09-16 NOTE — Telephone Encounter (Signed)
[facility] stating patient was calling them to get a refill on tramadol and Lyrica.  Please advise.  Clovis PuMartin, Tamika L, RN

## 2016-09-17 ENCOUNTER — Other Ambulatory Visit: Payer: Self-pay | Admitting: Family Medicine

## 2016-09-17 DIAGNOSIS — M797 Fibromyalgia: Secondary | ICD-10-CM

## 2016-09-17 MED ORDER — TRAMADOL HCL 50 MG PO TABS: 50.0000 mg | ORAL_TABLET | ORAL | 0 refills | Status: DC | PRN

## 2016-09-17 MED ORDER — PREGABALIN 75 MG PO CAPS: 75.0000 mg | ORAL_CAPSULE | Freq: Two times a day (BID) | ORAL | 0 refills | Status: DC

## 2016-09-17 NOTE — Telephone Encounter (Signed)
Pt called again about refills on lyrica and tramadol.  She says she goes thru this every month.  She doesn't have any medicine for weekend.

## 2016-09-17 NOTE — Telephone Encounter (Signed)
Needs refill on lyrica and tramadol. She is completely out of both.  Needs them to make it thru the weekenc[facility]

## 2016-09-29 ENCOUNTER — Other Ambulatory Visit: Payer: Self-pay | Admitting: Family Medicine

## 2016-09-29 DIAGNOSIS — I10 Essential (primary) hypertension: Secondary | ICD-10-CM

## 2016-10-18 ENCOUNTER — Other Ambulatory Visit: Payer: Self-pay | Admitting: *Deleted

## 2016-10-18 DIAGNOSIS — M797 Fibromyalgia: Secondary | ICD-10-CM

## 2016-10-19 NOTE — Telephone Encounter (Signed)
2nd request.  Iori Gigante L, RN  

## 2016-10-20 ENCOUNTER — Other Ambulatory Visit: Payer: Self-pay | Admitting: Family Medicine

## 2016-10-20 DIAGNOSIS — M797 Fibromyalgia: Secondary | ICD-10-CM

## 2016-10-20 MED ORDER — TRAMADOL HCL 50 MG PO TABS: 50.0000 mg | ORAL_TABLET | ORAL | 0 refills | Status: DC | PRN

## 2016-11-12 ENCOUNTER — Other Ambulatory Visit: Payer: Self-pay | Admitting: Family Medicine

## 2016-11-12 DIAGNOSIS — M797 Fibromyalgia: Secondary | ICD-10-CM

## 2016-11-15 ENCOUNTER — Other Ambulatory Visit: Payer: Self-pay | Admitting: *Deleted

## 2016-11-15 MED ORDER — PREGABALIN 75 MG PO CAPS: 75.0000 mg | ORAL_CAPSULE | Freq: Two times a day (BID) | ORAL | 0 refills | Status: DC

## 2016-11-15 NOTE — Telephone Encounter (Signed)
Medication called into pharmacy. Jazmin Hartsell,CMA  

## 2016-11-17 ENCOUNTER — Telehealth: Payer: Self-pay | Admitting: Family Medicine

## 2016-11-17 NOTE — Telephone Encounter (Signed)
Will forward to MD. Jazmin Hartsell,CMA  

## 2016-11-17 NOTE — Telephone Encounter (Signed)
Pt is calling because the doctor sent in the wrong QTY on her Tramadol. She takes this 3 times a day and gets 90 qty. She only received 45 QTY when this was called in. She has made an appointment for 11/26/16, but will not have enough Tramadol to last and would like the doctor to call in some more to last until her appointment next week. Please call patient when this is done so that she know to go pick this up. jw

## 2016-11-19 NOTE — Telephone Encounter (Signed)
Spoke with patient and she states that she is no longer taking her lyrica due to it causing weight gain.  Patient states that her script last read to take 1 tablet every 8 hours as needed and she was using 45 tablets over 30 days only because she wasn't able to get up here for refills when she ran out.  Patient also states that she is usually only taking 1 a day but will sometimes have to take more dependent on what she is doing that day.  Patient would prefer to stay on her tramadol as it was previously prescribed since it is the only thing that helps with her pain.  Also patient would like to know if she can at least get enough to last until her appointment next week.  Patient was upset that it is now the weekend and she is still going to be without any medication until she hears back next week.  Informed her that I would check with provider to see if he can give ok for enough until her appt but I wasn't sure and would let her know on Monday if I have heard anything. Sherry Jimenez,CMA

## 2016-11-21 NOTE — Telephone Encounter (Signed)
Jazmin,  Please if you can call in 7 pills of 50 mg tramadol for patient to last her until our appointment on Friday. If she is taking one a day and sometimes takes two then 45 pills should be enough for 30 days. I will keep her on tramadol but if will not be every 8 hours.  Thank you   Lovena NeighboursAbdoulaye Chanz Cahall, MD [facility], PGY-2

## 2016-11-22 ENCOUNTER — Encounter: Payer: Self-pay | Admitting: *Deleted

## 2016-11-22 ENCOUNTER — Telehealth: Payer: Self-pay | Admitting: Family Medicine

## 2016-11-22 NOTE — Telephone Encounter (Signed)
Medication called into pharmacy and sent mychart to patient letting her know. Jazmin Hartsell,CMA

## 2016-11-22 NOTE — Telephone Encounter (Signed)
Tried to call patient to confirm which pharmacy she wanted script called into but patient didn't answer and there was no answer and VM is full. Sherry Jimenez,CMA

## 2016-11-22 NOTE — Telephone Encounter (Signed)
The pharmacy is [place] [facility]

## 2016-11-22 NOTE — Telephone Encounter (Signed)
Spoke with pharmacist and verified that it is ok to go ahead and fill this script "early" since the last one written was for 1 tab daily #45 but patient took more than prescribed since she didn't agree with the decrease she received. Patient is to see provider on Friday and this issue will be discussed then. Steph Cheadle,CMA

## 2016-11-22 NOTE — Telephone Encounter (Signed)
[facility] [place]  On Tramadol RX, it shows as needed. Pt states she takes it 3 times a day. Please clarify

## 2016-11-26 ENCOUNTER — Encounter: Payer: Self-pay | Admitting: Family Medicine

## 2016-11-26 ENCOUNTER — Ambulatory Visit (INDEPENDENT_AMBULATORY_CARE_PROVIDER_SITE_OTHER): Payer: BC Managed Care – PPO | Admitting: Family Medicine

## 2016-11-26 DIAGNOSIS — M549 Dorsalgia, unspecified: Secondary | ICD-10-CM | POA: Diagnosis not present

## 2016-11-26 DIAGNOSIS — M797 Fibromyalgia: Secondary | ICD-10-CM | POA: Diagnosis not present

## 2016-11-26 DIAGNOSIS — O26893 Other specified pregnancy related conditions, third trimester: Secondary | ICD-10-CM

## 2016-11-26 DIAGNOSIS — O9989 Other specified diseases and conditions complicating pregnancy, childbirth and the puerperium: Principal | ICD-10-CM

## 2016-11-26 MED ORDER — CYCLOBENZAPRINE HCL 5 MG PO TABS: 5.0000 mg | ORAL_TABLET | ORAL | 0 refills | Status: DC | PRN

## 2016-11-26 MED ORDER — TRAMADOL HCL 50 MG PO TABS: 50.0000 mg | ORAL_TABLET | ORAL | 0 refills | Status: DC | PRN

## 2016-11-26 NOTE — Progress Notes (Signed)
medi

## 2016-11-26 NOTE — Patient Instructions (Signed)
It was great seeing you today! We have addressed the following issues today  1. I will see you in September for a follow up and blood work.  If we did any lab work today, and the results require attention, either me or my nurse will get in touch with you. If everything is normal, you will get a letter in mail and a message via . If you don't hear from us in two weeks, please give us a call. Otherwise, we look forward to seeing you again at your next visit. If you have any questions or concerns before then, please call the clinic at [phone].  Please bring all your medications to every doctors visit  Sign up for My Chart to have easy access to your labs results, and communication with your Primary care physician. Please ask Front Desk for some assistance.   Please check-out at the front desk before leaving the clinic.    Take Care,   Dr. Sydnee Cabaliallo

## 2016-11-29 NOTE — Progress Notes (Signed)
   Subjective:    Patient ID: Sherry Jimenez, female    DOB: 07/06/1980, 36 y.o.   MRN: [id number]   CC: Medications refill   HPI: Sherry Jimenez is a 36 yo female with past medical history significant for hypertension, gestational diabetes, fibromyalgia and obesity who present today to discuss her current medication regimen.   -Medications refill: Patient present today because this PCP has decreased her tramadol regimen from 90 to 45 tabs a month after patient canceled multiple appointments to discuss her regimen. Patient continue to endorse pain and fatigue in the setting of a complex social life. Patient was recently restarted on Lyrica after it was discontinued in November. Patient has decided to stop it again because it makes her gain weight. Patient has also stopped taking her Remus Lofflerambien because it made her so sleeping that she was not hearing her newborn at night. Patient would like to return to her initial tramadol regimen  -Obesity: Patient would like a pill for weight loss as she is unable to lose the "baby weight". Patient also discuss lab band surgery and expressed interest in the procedure.  Smoking status reviewed   ROS: all other systems were reviewed and are negative other than in the HPI   Past Medical History:  Diagnosis Date  . Anemia   . BV (bacterial vaginosis)   . Cholelithiasis   . Fibromyalgia   . Gestational diabetes    glyburide  . Hypertension   . UTI (lower urinary tract infection)   . Vaginal yeast infection     Objective:  BP 110/64   Pulse (!) 111   Temp 98.8 F (37.1 C) (Oral)   Ht 5\' 2"  (1.575 m)   Wt 262 lb (118.8 kg)   SpO2 96%   Breastfeeding? No   BMI 47.92 kg/m   Vitals and nursing note reviewed  General: Obese woman in NAD, able to participate in exam Cardiac: RRR, normal heart sounds, no murmurs. 2+ radial and PT pulses bilaterally Respiratory: CTAB, normal effort, No wheezes, rales or rhonchi Abdomen: soft, nontender,  nondistended, no hepatic or splenomegaly, +BS Extremities: no edema or cyanosis. WWP. Skin: warm and dry, no rashes noted Neuro: alert and oriented x4, no focal deficits Psych: Normal affect and mood   Assessment & Plan:   #Pain medications Discussed with patient my reasoning behind decrease tramadol regimen. Patient was reticent to making any changes. Gave patient the option to transfer pain management to a pain clinic, however patient has decided to continue relationship with decreased quantity. pateint was on heavy regimen of tramadol , ambien and lyrica. --Will prescribe tramadol 50 mg ( 60 tablets a month bid as needed) --Discontinue Lyrica and Ambien  --Continue to monitor symptoms   #Weight loss Discuss with patient weight loss, patient willing to make therapeutic lifestyle changes. Agree to consider weight loss medications if patient show a consitent effort. Patient will follow up in September. Plan is to lose 1 lb a week for the next two months.   Lovena NeighboursAbdoulaye Emberlyn Burlison, MD [facility] PGY-2

## 2016-12-07 ENCOUNTER — Other Ambulatory Visit: Payer: Self-pay | Admitting: *Deleted

## 2016-12-07 DIAGNOSIS — M549 Dorsalgia, unspecified: Secondary | ICD-10-CM

## 2016-12-07 DIAGNOSIS — O9989 Other specified diseases and conditions complicating pregnancy, childbirth and the puerperium: Principal | ICD-10-CM

## 2016-12-08 NOTE — Telephone Encounter (Signed)
2nd request.  Martin, Tamika L, RN  

## 2016-12-09 ENCOUNTER — Other Ambulatory Visit: Payer: Self-pay | Admitting: Family Medicine

## 2016-12-09 DIAGNOSIS — O9989 Other specified diseases and conditions complicating pregnancy, childbirth and the puerperium: Principal | ICD-10-CM

## 2016-12-09 DIAGNOSIS — M549 Dorsalgia, unspecified: Secondary | ICD-10-CM

## 2016-12-16 ENCOUNTER — Encounter: Payer: Self-pay | Admitting: Family Medicine

## 2016-12-29 ENCOUNTER — Other Ambulatory Visit: Payer: Self-pay | Admitting: *Deleted

## 2016-12-29 DIAGNOSIS — M797 Fibromyalgia: Secondary | ICD-10-CM

## 2016-12-30 MED ORDER — TRAMADOL HCL 50 MG PO TABS
50.0000 mg | ORAL_TABLET | ORAL | 0 refills | Status: DC | PRN
Start: 2016-12-30 — End: 2017-02-08

## 2016-12-31 ENCOUNTER — Encounter: Payer: Self-pay | Admitting: *Deleted

## 2016-12-31 NOTE — Telephone Encounter (Signed)
Medication called into pharmacy and mychart message sent to patient to make her aware. Sherry Jimenez,CMA

## 2017-01-12 ENCOUNTER — Ambulatory Visit: Payer: BC Managed Care – PPO | Admitting: Family Medicine

## 2017-01-18 ENCOUNTER — Other Ambulatory Visit: Payer: Self-pay | Admitting: Family Medicine

## 2017-01-18 DIAGNOSIS — I10 Essential (primary) hypertension: Secondary | ICD-10-CM

## 2017-01-18 DIAGNOSIS — M797 Fibromyalgia: Secondary | ICD-10-CM

## 2017-01-20 ENCOUNTER — Ambulatory Visit: Payer: BC Managed Care – PPO | Admitting: Family Medicine

## 2017-02-07 ENCOUNTER — Other Ambulatory Visit: Payer: Self-pay | Admitting: *Deleted

## 2017-02-07 MED ORDER — LEVOCETIRIZINE DIHYDROCHLORIDE 5 MG PO TABS: 5.0000 mg | ORAL_TABLET | Freq: Every evening | ORAL | 2 refills | Status: DC

## 2017-02-08 ENCOUNTER — Other Ambulatory Visit: Payer: Self-pay | Admitting: *Deleted

## 2017-02-08 DIAGNOSIS — O9989 Other specified diseases and conditions complicating pregnancy, childbirth and the puerperium: Principal | ICD-10-CM

## 2017-02-08 DIAGNOSIS — M549 Dorsalgia, unspecified: Secondary | ICD-10-CM

## 2017-02-08 DIAGNOSIS — O99891 Other specified diseases and conditions complicating pregnancy: Secondary | ICD-10-CM

## 2017-02-08 DIAGNOSIS — M797 Fibromyalgia: Secondary | ICD-10-CM

## 2017-02-09 ENCOUNTER — Encounter: Payer: Self-pay | Admitting: *Deleted

## 2017-02-09 MED ORDER — TRAMADOL HCL 50 MG PO TABS: 50.0000 mg | ORAL_TABLET | ORAL | 0 refills | Status: DC | PRN

## 2017-02-09 NOTE — Addendum Note (Signed)
Addended by: Henri Medal on: 02/09/2017 11:13 AM   Modules accepted: Orders

## 2017-02-09 NOTE — Telephone Encounter (Signed)
LM on prescriber line for [facility] [place] with refill information.  I also sent a mychart message to patient letting her know that this has been taken care of. Jazmin Hartsell,CMA

## 2017-02-09 NOTE — Telephone Encounter (Signed)
Please call in the Tramadol. But do not refill the Flexeril.  Thank you  Lovena Neighbours, MD [facility], PGY-2

## 2017-03-28 ENCOUNTER — Encounter: Payer: Self-pay | Admitting: *Deleted

## 2017-03-28 ENCOUNTER — Other Ambulatory Visit: Payer: Self-pay | Admitting: Family Medicine

## 2017-03-28 DIAGNOSIS — M797 Fibromyalgia: Secondary | ICD-10-CM

## 2017-03-28 MED ORDER — TRAMADOL HCL 50 MG PO TABS: 50.0000 mg | ORAL_TABLET | ORAL | 0 refills | Status: DC | PRN

## 2017-03-28 NOTE — Progress Notes (Signed)
Medication called into the pharmacy and mychart message sent to patient. Jazmin Hartsell,CMA

## 2017-04-20 ENCOUNTER — Ambulatory Visit: Payer: Self-pay | Admitting: Family Medicine

## 2017-04-26 ENCOUNTER — Other Ambulatory Visit: Payer: Self-pay | Admitting: Family Medicine

## 2017-04-26 DIAGNOSIS — M797 Fibromyalgia: Secondary | ICD-10-CM

## 2017-04-29 ENCOUNTER — Other Ambulatory Visit: Payer: Self-pay

## 2017-04-29 ENCOUNTER — Encounter: Payer: Self-pay | Admitting: Family Medicine

## 2017-04-29 ENCOUNTER — Ambulatory Visit (INDEPENDENT_AMBULATORY_CARE_PROVIDER_SITE_OTHER): Payer: Self-pay | Admitting: Family Medicine

## 2017-04-29 VITALS — BP 118/74 | HR 97 | Temp 98.5°F | Ht 62.0 in | Wt 238.0 lb

## 2017-04-29 DIAGNOSIS — Z131 Encounter for screening for diabetes mellitus: Secondary | ICD-10-CM

## 2017-04-29 DIAGNOSIS — J029 Acute pharyngitis, unspecified: Secondary | ICD-10-CM

## 2017-04-29 DIAGNOSIS — Z1329 Encounter for screening for other suspected endocrine disorder: Secondary | ICD-10-CM

## 2017-04-29 DIAGNOSIS — I10 Essential (primary) hypertension: Secondary | ICD-10-CM

## 2017-04-29 LAB — POCT RAPID STREP A (OFFICE): Rapid Strep A Screen: POSITIVE — AB

## 2017-04-29 MED ORDER — PENICILLIN G BENZATHINE 1200000 UNIT/2ML IM SUSP
1.2000 10*6.[IU] | Freq: Once | INTRAMUSCULAR | Status: AC
  Administered 2017-04-29: 1.2 10*6.[IU] via INTRAMUSCULAR

## 2017-04-29 MED ORDER — CETIRIZINE HCL 10 MG PO TABS: 10.0000 mg | ORAL_TABLET | Freq: Every day | ORAL | 11 refills | Status: AC

## 2017-04-29 NOTE — Progress Notes (Signed)
Subjective:    Patient ID: Sherry QuinonesShanita S Jimenez, female    DOB: 08/24/1980, 36 y.o.   MRN: [id number]   CC: Throat pain  HPI: Patient is a 36 year old female with a past medical history significant for hypertension, obesity, fibromyalgia who presents today complaining of throat pain.  Patient reports her symptoms started 3 days ago.  They are associated with cough and congestion.  Patient reports that 2 of her kids have been diagnosed with strep throat.  Patient has had a recent tonsillectomy without any complication.  Patient was told that he would minimize her chances of having strep throat.  Patient denies any fevers or chills.  Patient denies any shortness of breath, chest pain, abdominal pain, headaches, dizziness, nausea, vomiting.  Patient also reports that she has been making some dietary changes and still trying to lose weight.  She reports she has been unable to work out as discussed at previous office visit.  Smoking status reviewed   ROS: all other systems were reviewed and are negative other than in the HPI   Past Medical History:  Diagnosis Date  . Anemia   . BV (bacterial vaginosis)   . Cholelithiasis   . Fibromyalgia   . Gestational diabetes    glyburide  . Hypertension   . UTI (lower urinary tract infection)   . Vaginal yeast infection     Past Surgical History:  Procedure Laterality Date  . CHOLECYSTECTOMY    . TONSILECTOMY, ADENOIDECTOMY, BILATERAL MYRINGOTOMY AND TUBES    . TUBAL LIGATION N/A 02/06/2016   Procedure: POST PARTUM TUBAL LIGATION;  Surgeon: Hermina StaggersMichael L Ervin, MD;  Location: [facility];  Service: Gynecology;  Laterality: N/A;    Past medical history, surgical, family, and social history reviewed and updated in the EMR as appropriate.  Objective:  BP 118/74   Pulse 97   Temp 98.5 F (36.9 C) (Oral)   Ht 5\' 2"  (1.575 m)   Wt 238 lb (108 kg)   LMP 02/27/2017   SpO2 99%   Breastfeeding? No   BMI 43.53 kg/m   Vitals and nursing note  reviewed  General: NAD, pleasant, able to participate in exam HEENT:  moist mucous membrane, mild erythematous pharynx uvula is midline no deviation.  Tongue with normal appearance.  No exudate. Cardiac: RRR, normal heart sounds, no murmurs. 2+ radial and PT pulses bilaterally Respiratory: CTAB, normal effort, No wheezes, rales or rhonchi Abdomen: soft, nontender, nondistended, no hepatic or splenomegaly, +BS Extremities: no edema or cyanosis. WWP. Skin: warm and dry, no rashes noted Neuro: alert and oriented x4, no focal deficits Psych: Normal affect and mood   Assessment & Plan:    #Chronic pain, acute, worsening Mildly concerning exam in combination with described symptoms and sick contacts.  Oral swab was positive for strep pharyngitis.  Patient was given penicillin G 1.41M IM in office.  We will follow-up as needed  #Obesity Patient has lost 25 pounds since last office visit in July.  Patient reports modification in dietary habits.  Encourage patient to continue with current TLC.  Encouraged exercise in addition to dietary changes.  #Screening for hypothyroidism, type II diabetes Patient for history of gestational diabetes last A1c was 5.0.  Last TSH in 2014 was within normal limits --Order TSH and A1c  #Hypertension, well-controlled Today blood pressure is 118/74.  We will continue current management. --Follow-up on CMP and CBC  Discontinue xyzal and restarted zyrtec 10 mg daily as needed.   Sherry NeighboursAbdoulaye Maliik Karner, MD [facility]  Family Medicine PGY-2

## 2017-04-30 LAB — CBC WITH DIFFERENTIAL/PLATELET
BASOS ABS: 0 10*3/uL (ref 0.0–0.2)
Basos: 1 %
EOS (ABSOLUTE): 0.4 10*3/uL (ref 0.0–0.4)
Eos: 5 %
HEMATOCRIT: 36.7 % (ref 34.0–46.6)
Hemoglobin: 11.7 g/dL (ref 11.1–15.9)
IMMATURE GRANULOCYTES: 0 %
Immature Grans (Abs): 0 10*3/uL (ref 0.0–0.1)
LYMPHS ABS: 2.6 10*3/uL (ref 0.7–3.1)
Lymphs: 33 %
MCH: 24.9 pg — ABNORMAL LOW (ref 26.6–33.0)
MCHC: 31.9 g/dL (ref 31.5–35.7)
MCV: 78 fL — ABNORMAL LOW (ref 79–97)
MONOS ABS: 0.8 10*3/uL (ref 0.1–0.9)
Monocytes: 10 %
NEUTROS PCT: 51 %
Neutrophils Absolute: 4.2 10*3/uL (ref 1.4–7.0)
PLATELETS: 340 10*3/uL (ref 150–379)
RBC: 4.69 x10E6/uL (ref 3.77–5.28)
RDW: 15.2 % (ref 12.3–15.4)
WBC: 8 10*3/uL (ref 3.4–10.8)

## 2017-04-30 LAB — CMP14+EGFR
ALK PHOS: 88 IU/L (ref 39–117)
ALT: 10 IU/L (ref 0–32)
AST: 16 IU/L (ref 0–40)
Albumin/Globulin Ratio: 1.4 (ref 1.2–2.2)
Albumin: 4.5 g/dL (ref 3.5–5.5)
BUN/Creatinine Ratio: 10 (ref 9–23)
BUN: 7 mg/dL (ref 6–20)
Bilirubin Total: <0.2 mg/dL (ref 0.0–1.2)
CALCIUM: 9.6 mg/dL (ref 8.7–10.2)
CO2: 25 mmol/L (ref 20–29)
CREATININE: 0.7 mg/dL (ref 0.57–1.00)
Chloride: 97 mmol/L (ref 96–106)
GFR calc Af Amer: 128 mL/min/{1.73_m2} (ref >59)
GFR calc non Af Amer: 111 mL/min/{1.73_m2} (ref >59)
GLOBULIN, TOTAL: 3.3 g/dL (ref 1.5–4.5)
GLUCOSE: 95 mg/dL (ref 65–99)
POTASSIUM: 3.3 mmol/L — AB (ref 3.5–5.2)
SODIUM: 141 mmol/L (ref 134–144)
Total Protein: 7.8 g/dL (ref 6.0–8.5)

## 2017-04-30 LAB — HEMOGLOBIN A1C
ESTIMATED AVERAGE GLUCOSE: 111 mg/dL
Hgb A1c MFr Bld: 5.5 % (ref 4.8–5.6)

## 2017-04-30 LAB — TSH: TSH: 2.13 u[IU]/mL (ref 0.450–4.500)

## 2017-06-13 ENCOUNTER — Other Ambulatory Visit: Payer: Self-pay | Admitting: *Deleted

## 2017-06-13 ENCOUNTER — Other Ambulatory Visit: Payer: Self-pay | Admitting: Family Medicine

## 2017-06-13 DIAGNOSIS — M797 Fibromyalgia: Secondary | ICD-10-CM

## 2017-06-13 MED ORDER — TRAMADOL HCL 50 MG PO TABS: 50.0000 mg | ORAL_TABLET | ORAL | 0 refills | Status: DC | PRN

## 2017-07-15 IMAGING — US US OB COMP LESS 14 WK
1 series · 15 of 28 positions shown · non-contrast
Comparison: None.

CLINICAL DATA: Unsure of LMP.

EXAM:
OBSTETRIC <14 WK US AND TRANSVAGINAL OB US
TECHNIQUE: Both transabdominal and transvaginal ultrasound examinations were
performed for complete evaluation of the gestation as well as the
maternal uterus, adnexal regions, and pelvic cul-de-sac.
Transvaginal technique was performed to assess early pregnancy.

[Series 1: us ob comp less 14 wk · 42 acquisitions, 15 frames shown]
[im 1/42]
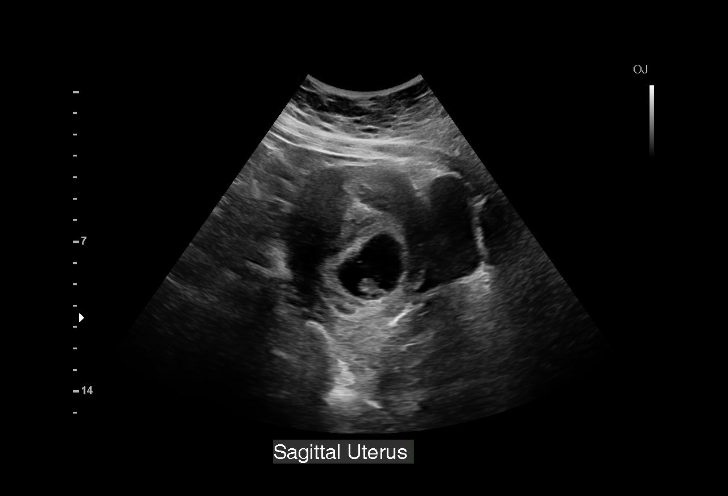
[im 4/42]
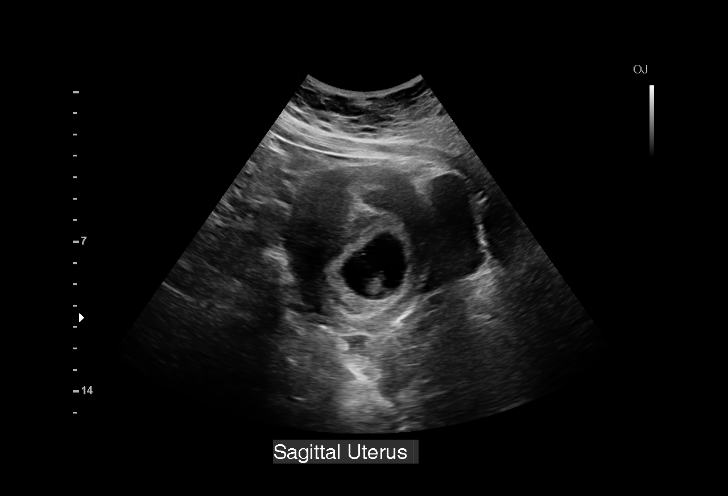
[im 7/42]
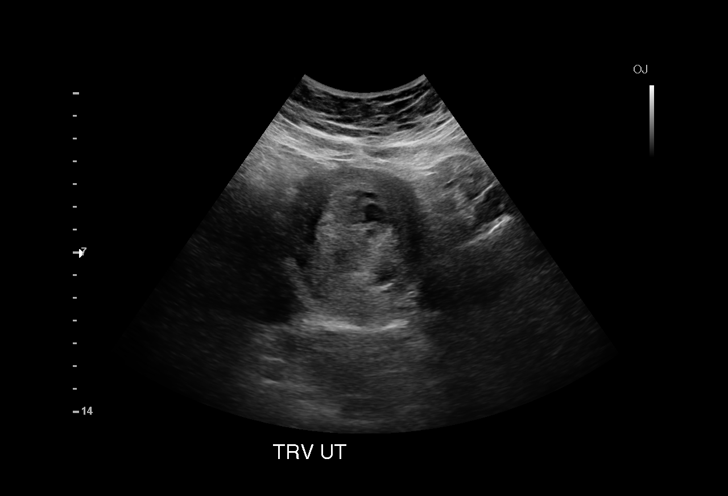
[im 10/42]
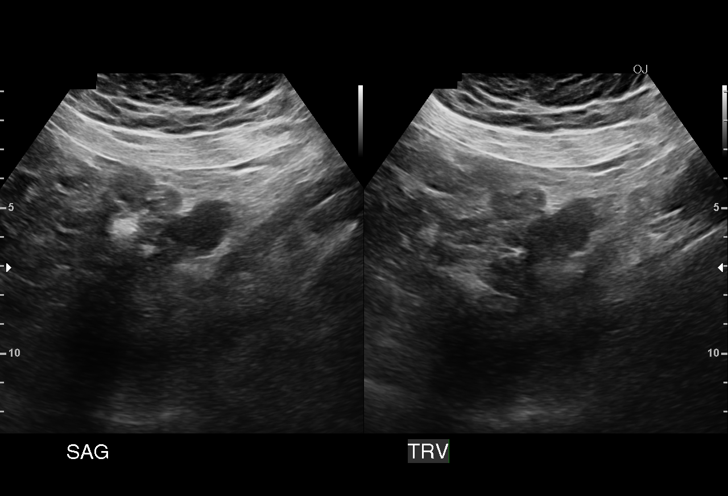
[im 13/42]
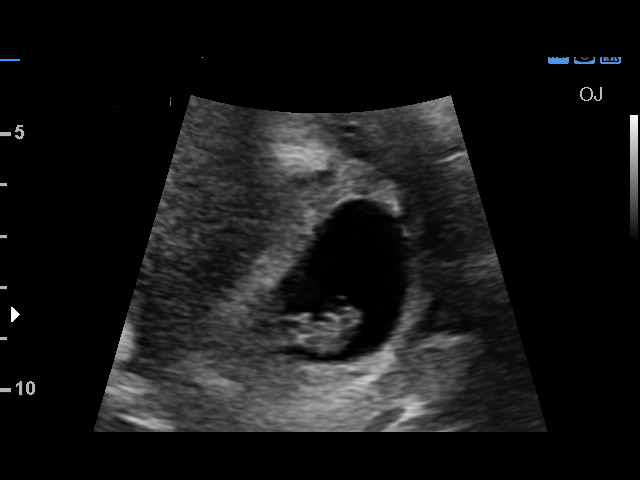
[im 16/42]
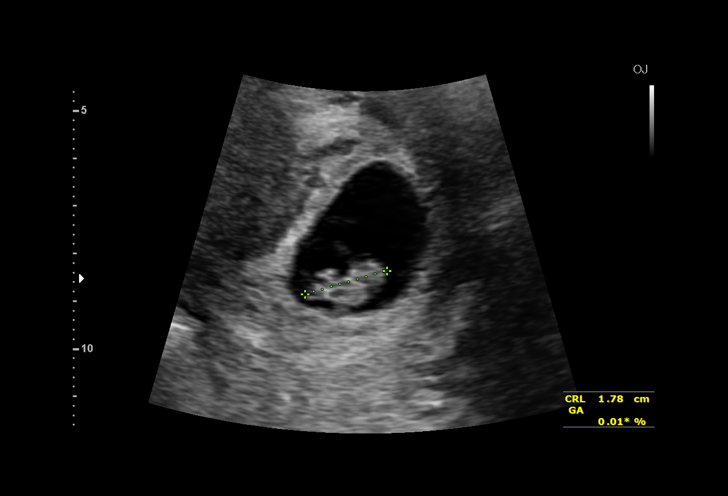
[im 19/42]
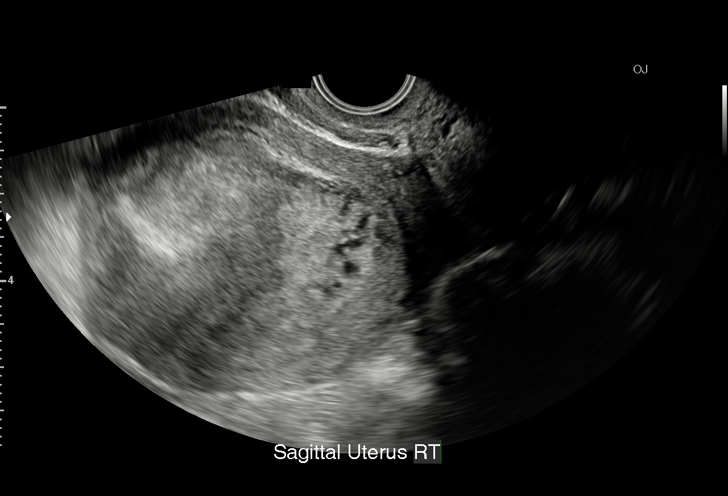
[im 22/42]
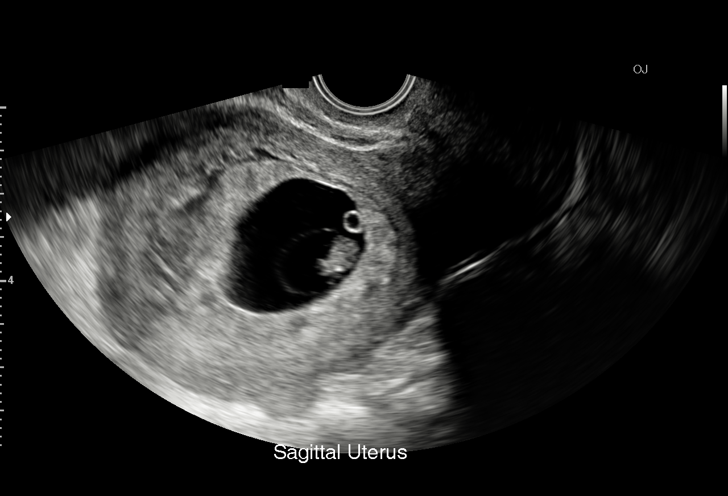
[im 23/42]
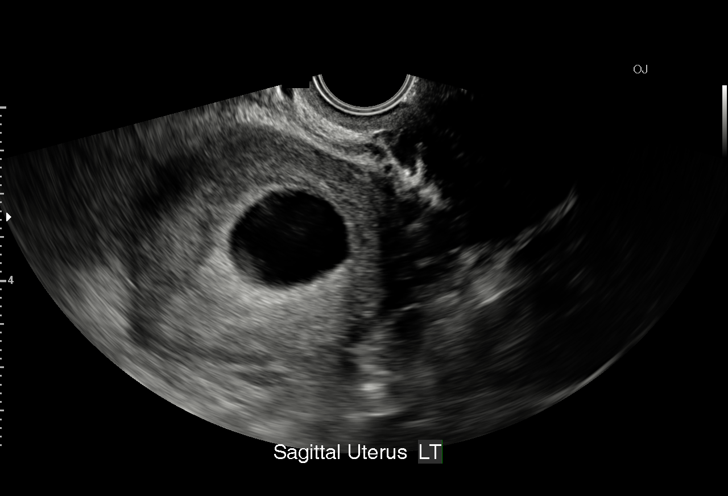
[im 26/42]
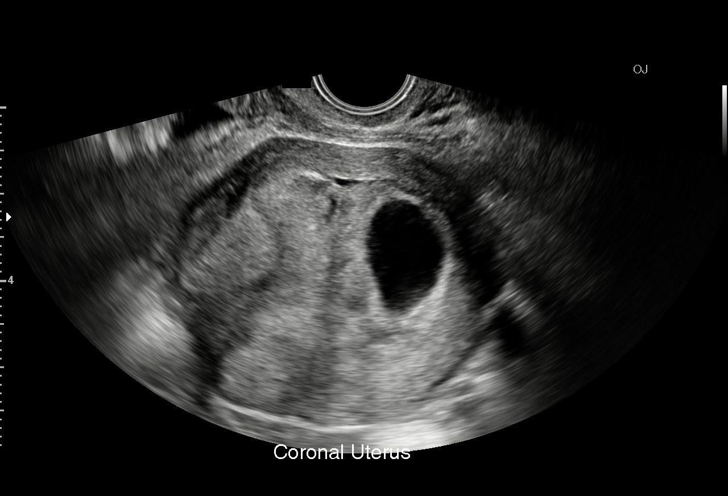
[im 29/42]
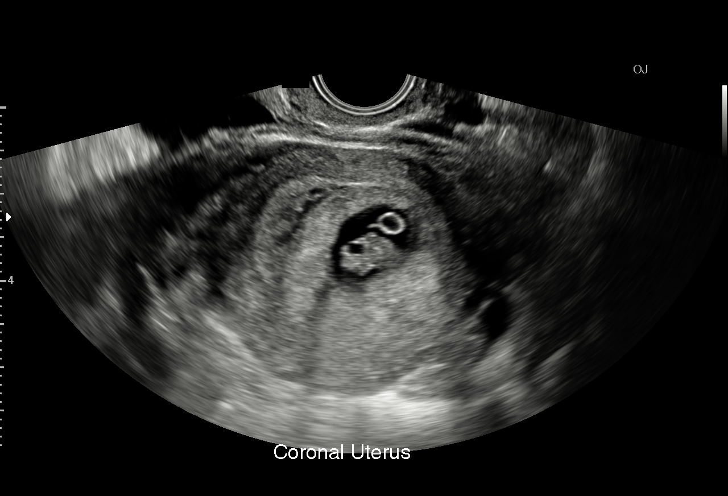
[im 32/42]
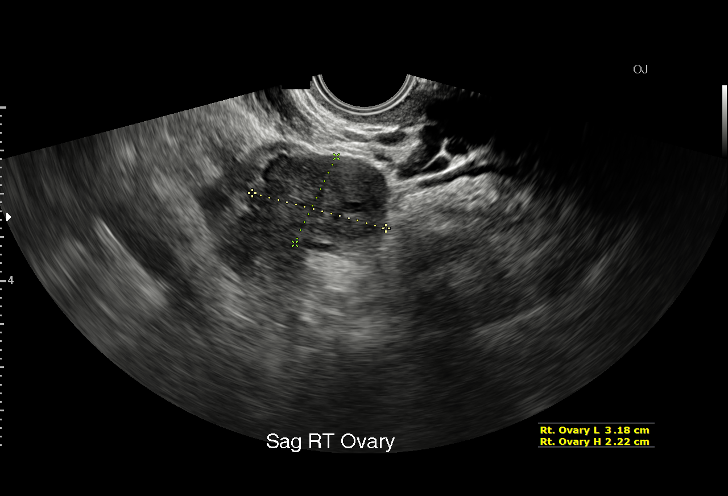
[im 35/42]
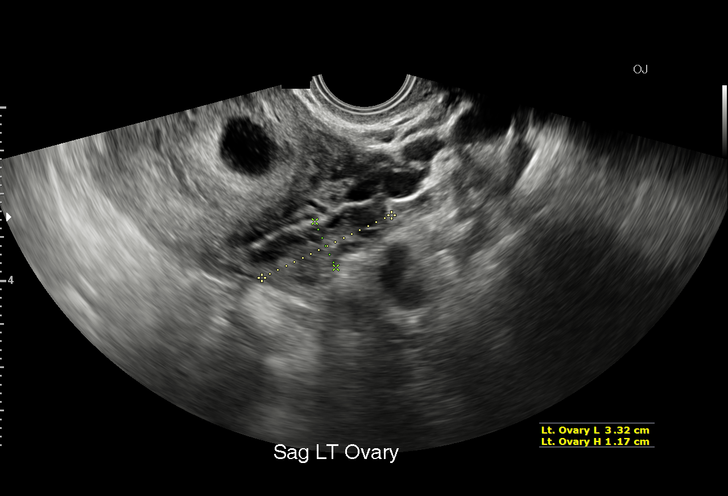
[im 38/42]
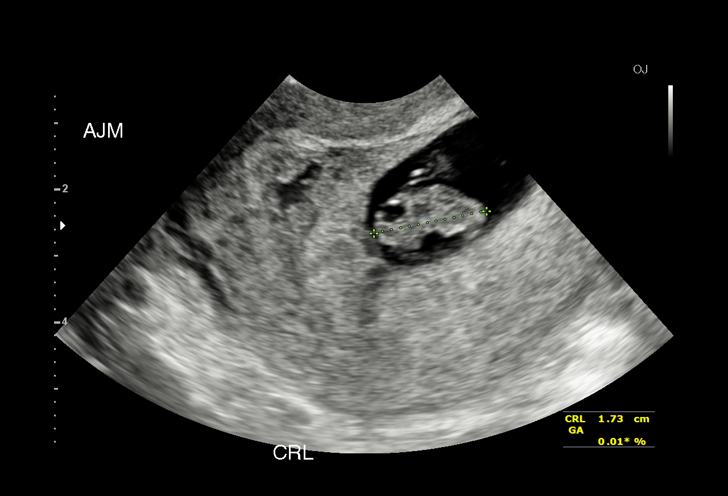
[im 42/42]
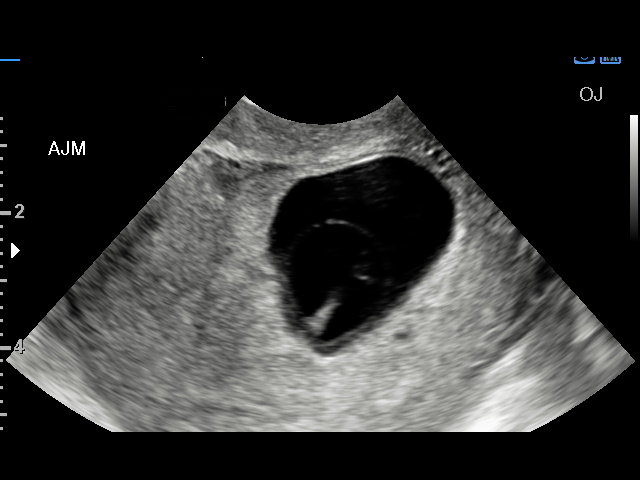

[15 of 28 positions shown; findings below may reference images not displayed]

FINDINGS: Intrauterine gestational sac: Visualized/normal in shape.

Yolk sac:  Visualized

Embryo:  Visualized

Cardiac Activity: Visualized

Heart Rate: 168  bpm

CRL:  17  mm   8 w   1 d                  US EDC: 02/12/2016

Subchorionic hemorrhage: Tiny subchorionic hemorrhage or
implantation bleed noted.

Maternal uterus/adnexae: Both ovaries are normal in appearance. No
mass or abnormal free fluid identified.
IMPRESSION: Single living IUP measuring 8 weeks 1 day with US EDC of 02/12/2016.

Tiny subchorionic hemorrhage or implantation bleed noted, which is
of doubtful clinical significance in the absence of bleeding.

## 2017-08-01 ENCOUNTER — Other Ambulatory Visit: Payer: Self-pay | Admitting: Family Medicine

## 2017-08-01 DIAGNOSIS — I10 Essential (primary) hypertension: Secondary | ICD-10-CM

## 2017-09-01 ENCOUNTER — Other Ambulatory Visit: Payer: Self-pay | Admitting: *Deleted

## 2017-09-01 DIAGNOSIS — M797 Fibromyalgia: Secondary | ICD-10-CM

## 2017-09-01 MED ORDER — TRAMADOL HCL 50 MG PO TABS: 50.0000 mg | ORAL_TABLET | ORAL | 0 refills | Status: DC | PRN

## 2017-09-05 ENCOUNTER — Other Ambulatory Visit: Payer: Self-pay | Admitting: *Deleted

## 2017-09-05 MED ORDER — LISINOPRIL 40 MG PO TABS: 40.0000 mg | ORAL_TABLET | Freq: Every day | ORAL | 3 refills | Status: DC

## 2017-10-26 ENCOUNTER — Other Ambulatory Visit: Payer: Self-pay | Admitting: Family Medicine

## 2017-10-26 DIAGNOSIS — I10 Essential (primary) hypertension: Secondary | ICD-10-CM

## 2018-01-26 ENCOUNTER — Telehealth: Payer: Self-pay

## 2018-01-26 NOTE — Telephone Encounter (Signed)
Patient left message that she has conjunctivitis and works at an [employer]. Has appt here tomorrow but wants to know if abx drops can be sent in today so she can return to work tomorrow before appt.  Ples SpecterAlisa Brake, RN [facility] RN)

## 2018-01-27 ENCOUNTER — Other Ambulatory Visit: Payer: Self-pay

## 2018-01-27 ENCOUNTER — Ambulatory Visit (INDEPENDENT_AMBULATORY_CARE_PROVIDER_SITE_OTHER): Payer: Self-pay | Admitting: Family Medicine

## 2018-01-27 ENCOUNTER — Encounter: Payer: Self-pay | Admitting: Family Medicine

## 2018-01-27 VITALS — BP 110/60 | HR 90 | Temp 98.2°F | Wt 207.6 lb

## 2018-01-27 DIAGNOSIS — M797 Fibromyalgia: Secondary | ICD-10-CM

## 2018-01-27 DIAGNOSIS — I10 Essential (primary) hypertension: Secondary | ICD-10-CM

## 2018-01-27 DIAGNOSIS — R1013 Epigastric pain: Secondary | ICD-10-CM

## 2018-01-27 DIAGNOSIS — B354 Tinea corporis: Secondary | ICD-10-CM

## 2018-01-27 LAB — POCT H PYLORI SCREEN: H PYLORI SCREEN, POC: NEGATIVE

## 2018-01-27 MED ORDER — TRAMADOL HCL 50 MG PO TABS: 50.0000 mg | ORAL_TABLET | ORAL | 0 refills | Status: DC | PRN

## 2018-01-27 MED ORDER — CLOTRIMAZOLE 1 % EX OINT: TOPICAL_OINTMENT | CUTANEOUS | 0 refills | Status: DC

## 2018-01-27 MED ORDER — HYDROCHLOROTHIAZIDE 25 MG PO TABS: 25.0000 mg | ORAL_TABLET | Freq: Every day | ORAL | 0 refills | Status: DC

## 2018-01-27 NOTE — Progress Notes (Addendum)
Subjective:  Sherry Jimenez is a 37 y.o. female who presents to the [facility] today with a chief complaint of decrease appetite, and epigastric pain since June.   GI Concerns  Patient reports that since May or June her appetite has decreased. She feels like she can not eat at times and must force herself to eat. She reports nausea for much of time. She acknowledges unintentional weight loss. On chart review patient has lost 55 pounds since July 2018. Of note, patient delivered her last child in November 2017 and she is now close to pre-pregnancy weight.   Patient also reports alternating constipation and diarrhea and says that the diarrhea is "frothy". Patient admits to abdominal pain in epigastric area that radiates to her back. Patient reports the only change around June was that she discontinued her Celexa because she felt like it wasn't helping with her stress. Patient endorses family hx of cancer with a paternal uncle and cousin who passed away from colon cancer at a younger age (68,28 respectively). Patient has tried limiting grain products which she feels has helped with her symptoms. She also reports she does not eat/drink milk containing products due to GI issues in the past. Patient limits spicy foods due to reflux.   Denies fever, CP, SOB, vomiting. Admits to nausea, constipation, diarrhea.   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  Rash itches and is scaling.   Objective:  Physical Exam: BP 110/60 (BP Location: Left Arm)   Pulse 90   Temp 98.2 F (36.8 C) (Oral)   Wt 207 lb 9.6 oz (94.2 kg)   SpO2 95%   BMI 37.97 kg/m   Gen: NAD, resting comfortably CV: RRR with no murmurs appreciated Pulm: NWOB, CTAB with no crackles, wheezes, or rhonchi GI: Normal bowel sounds present. Soft, endorses discomfort  MSK: no edema, cyanosis, or clubbing noted Skin: warm, dry, circular  raised scaly lesion with central clearing above right scapula  Neuro: grossly normal, moves all extremities Psych: Normal affect and thought content  Assessment/Plan:  Sherry Jimenez is a 37 y.o. female [facility] a chief complaint of decrease appetite, weight loss and abdominal/epigastric pain for the past 3 months .   Abdominal/epigastric pain with weight loss and loss of appetite -- Differential includes adverse reaction due to discontinuation of celexa given nausea. Celiac disease with reported frothy stools and some improvement in symptoms with gluten free diet. IBS given stress at home. Pancreatitis with epigastric pain radiating to her back. She also fit the classic female, obese patient. She is s/p cholecystectomy. Colon cancer is also very much a possibility given family history though not first degree relative. She denies any stool in her blood, but would warranted a GI work up if hemoccult positive. -- Cologuard ordered  -- Lipase, Celiac screen, H Pylori screen  -- Follow up in one month   Tinea Corporis Lesion on her neck consistent with tinea corporis. -- Prescribed clotrimazole 1% ointment to be applied to affected areas bid --Follow up in clinic in one month  Hypertension BP today 110/60, previously on HCTZ 25 mg daily and lisinopril 40 mg daily. Given low BP will decrease lisinopril to 20 mg and continue with current dosage of HCTZ. --Will continue to monitor  I have seen and evaluated the patient with Medical student Ramal Eckhardt. I am in agreement  with the note above in its revised form.    Lovena NeighboursAbdoulaye Diallo, MD Family Medicine, PGY-3   Charna ElizabethJohn Victor Sasan Wilkie, Ms4 [facility] [employer] [facility] [employer]  01/27/2018 4:15 PM

## 2018-01-27 NOTE — Patient Instructions (Addendum)
It was great seeing you today! We have addressed the following issues today  1. Cut down your lisinopril to 20 mg daily. 2. Continue with the HCTZ 3. I prescribe an ointment for the ring worm on your neck. Use as prescribed. 4. I will check you for celiac and H pylori as well as blood in your stool and pancreatitis.  5. I will see you in 1 month.  If we did any lab work today, and the results require attention, either me or my nurse will get in touch with you. If everything is normal, you will get a letter in mail and a message via . If you don't hear from us in two weeks, please give us a call. Otherwise, we look forward to seeing you again at your next visit. If you have any questions or concerns before then, please call the clinic at [phone].  Please bring all your medications to every doctors visit  Sign up for My Chart to have easy access to your labs results, and communication with your Primary care physician. Please ask Front Desk for some assistance.   Please check-out at the front desk before leaving the clinic.    Take Care,   Dr. Sydnee Cabaliallo

## 2018-01-29 ENCOUNTER — Other Ambulatory Visit: Payer: Self-pay | Admitting: Family Medicine

## 2018-01-29 DIAGNOSIS — B354 Tinea corporis: Secondary | ICD-10-CM

## 2018-01-29 LAB — CELIAC DISEASE COMPREHENSIVE PANEL WITH REFLEXES: IgA/Immunoglobulin A, Serum: 676 mg/dL — ABNORMAL HIGH (ref 87–352)

## 2018-01-29 LAB — LIPASE: LIPASE: 23 U/L (ref 14–72)

## 2018-01-30 NOTE — Telephone Encounter (Signed)
Received message from pharmacy.  Cream unavailable, can you resend as ointment?

## 2018-02-01 MED ORDER — CLOTRIMAZOLE 1 % EX OINT: TOPICAL_OINTMENT | CUTANEOUS | 0 refills | Status: AC

## 2018-02-02 ENCOUNTER — Telehealth: Payer: Self-pay | Admitting: Family Medicine

## 2018-02-02 NOTE — Telephone Encounter (Signed)
Will forward to Dr. Sydnee Cabal to explain abnormal results to patient.  Jazmin Hartsell,CMA

## 2018-02-02 NOTE — Telephone Encounter (Signed)
Pt is calling concerning the lab results she received yesterday. She is concerned about some of the levels. She said it did not state if she needed to f/u with another appointment or not. She has an appointment with Dr. Sydnee Cabal on 03/10/18. She would like for someone to call her to discuss if it is okay to wait to be seen then or if she should come in sooner due to her latest results.

## 2018-02-02 NOTE — Telephone Encounter (Signed)
Will call patient tomorrow and discuss results. There is no urgency.  Thanks  Lovena Neighbours, MD [facility], PGY-3]

## 2018-02-06 NOTE — Telephone Encounter (Signed)
Pt would like the doctor to call her after 2 pm today since she is at work. jw

## 2018-02-06 NOTE — Telephone Encounter (Signed)
Will let MD know. Jazmin Hartsell,CMA  

## 2018-02-09 ENCOUNTER — Encounter: Payer: Self-pay | Admitting: *Deleted

## 2018-02-23 ENCOUNTER — Ambulatory Visit: Payer: Medicaid Other | Admitting: Family Medicine

## 2018-03-06 ENCOUNTER — Other Ambulatory Visit: Payer: Self-pay | Admitting: Family Medicine

## 2018-03-10 ENCOUNTER — Ambulatory Visit: Payer: Medicaid Other | Admitting: Family Medicine

## 2018-04-20 ENCOUNTER — Other Ambulatory Visit: Payer: Self-pay

## 2018-04-20 DIAGNOSIS — M797 Fibromyalgia: Secondary | ICD-10-CM

## 2018-04-20 MED ORDER — TRAMADOL HCL 50 MG PO TABS: 50.0000 mg | ORAL_TABLET | ORAL | 0 refills | Status: DC | PRN

## 2018-08-08 ENCOUNTER — Other Ambulatory Visit: Payer: Self-pay | Admitting: Family Medicine

## 2018-08-08 DIAGNOSIS — M797 Fibromyalgia: Secondary | ICD-10-CM

## 2018-08-26 ENCOUNTER — Other Ambulatory Visit: Payer: Self-pay | Admitting: Family Medicine

## 2018-08-26 DIAGNOSIS — I10 Essential (primary) hypertension: Secondary | ICD-10-CM

## 2019-05-07 ENCOUNTER — Other Ambulatory Visit: Payer: Self-pay | Admitting: *Deleted

## 2019-05-07 ENCOUNTER — Ambulatory Visit: Payer: Medicaid Other | Admitting: Family Medicine

## 2019-05-07 NOTE — Telephone Encounter (Signed)
Received refill request from pharmacy for tramadol.  Informed patient that would be unable to fill this medication without an office visit since she hasn't been seen in the office in a year.  Patient voiced understanding and appt was made for her to see a provider this afternoon.  States that she couldn't wait to see her new PCP.  Sherry Jimenez,CMA

## 2020-03-10 ENCOUNTER — Encounter (HOSPITAL_COMMUNITY): Payer: Self-pay

## 2020-03-10 ENCOUNTER — Emergency Department (HOSPITAL_COMMUNITY): Payer: Medicaid Other

## 2020-03-10 ENCOUNTER — Emergency Department (HOSPITAL_COMMUNITY)
Admission: EM | Admit: 2020-03-10 | Discharge: 2020-03-10 | Disposition: A | Discharge status: Home / Self Care | Payer: Medicaid Other | Attending: Emergency Medicine | Admitting: Emergency Medicine

## 2020-03-10 ENCOUNTER — Other Ambulatory Visit: Payer: Self-pay

## 2020-03-10 ENCOUNTER — Ambulatory Visit: Payer: Medicaid Other | Admitting: Family Medicine

## 2020-03-10 DIAGNOSIS — U071 COVID-19: Secondary | ICD-10-CM | POA: Insufficient documentation

## 2020-03-10 DIAGNOSIS — D72819 Decreased white blood cell count, unspecified: Secondary | ICD-10-CM | POA: Diagnosis not present

## 2020-03-10 DIAGNOSIS — E876 Hypokalemia: Secondary | ICD-10-CM | POA: Insufficient documentation

## 2020-03-10 DIAGNOSIS — Z87891 Personal history of nicotine dependence: Secondary | ICD-10-CM | POA: Insufficient documentation

## 2020-03-10 DIAGNOSIS — I10 Essential (primary) hypertension: Secondary | ICD-10-CM | POA: Diagnosis not present

## 2020-03-10 DIAGNOSIS — M791 Myalgia, unspecified site: Secondary | ICD-10-CM | POA: Diagnosis not present

## 2020-03-10 DIAGNOSIS — Z79899 Other long term (current) drug therapy: Secondary | ICD-10-CM | POA: Insufficient documentation

## 2020-03-10 DIAGNOSIS — R52 Pain, unspecified: Secondary | ICD-10-CM

## 2020-03-10 DIAGNOSIS — R519 Headache, unspecified: Secondary | ICD-10-CM | POA: Diagnosis present

## 2020-03-10 HISTORY — DX: Hypokalemia: E87.6

## 2020-03-10 LAB — RESPIRATORY PANEL BY RT PCR (FLU A&B, COVID)
Influenza A by PCR: NEGATIVE
Influenza B by PCR: NEGATIVE
SARS Coronavirus 2 by RT PCR: POSITIVE — AB

## 2020-03-10 LAB — URINALYSIS, ROUTINE W REFLEX MICROSCOPIC
Bilirubin Urine: NEGATIVE
Glucose, UA: NEGATIVE mg/dL
Ketones, ur: NEGATIVE mg/dL
Leukocytes,Ua: NEGATIVE
Nitrite: NEGATIVE
Protein, ur: NEGATIVE mg/dL
Specific Gravity, Urine: 1.006 (ref 1.005–1.030)
pH: 6 (ref 5.0–8.0)

## 2020-03-10 LAB — COMPREHENSIVE METABOLIC PANEL
ALT: 10 U/L (ref 0–44)
AST: 15 U/L (ref 15–41)
Albumin: 4 g/dL (ref 3.5–5.0)
Alkaline Phosphatase: 51 U/L (ref 38–126)
Anion gap: 9 (ref 5–15)
BUN: <5 mg/dL — ABNORMAL LOW (ref 6–20)
CO2: 23 mmol/L (ref 22–32)
Calcium: 9 mg/dL (ref 8.9–10.3)
Chloride: 106 mmol/L (ref 98–111)
Creatinine, Ser: 0.79 mg/dL (ref 0.44–1.00)
GFR, Estimated: >60 mL/min (ref >60)
Glucose, Bld: 96 mg/dL (ref 70–99)
Potassium: 3.4 mmol/L — ABNORMAL LOW (ref 3.5–5.1)
Sodium: 138 mmol/L (ref 135–145)
Total Bilirubin: 0.4 mg/dL (ref 0.3–1.2)
Total Protein: 7.5 g/dL (ref 6.5–8.1)

## 2020-03-10 LAB — CBC
HCT: 35.5 % — ABNORMAL LOW (ref 36.0–46.0)
Hemoglobin: 11.7 g/dL — ABNORMAL LOW (ref 12.0–15.0)
MCH: 27 pg (ref 26.0–34.0)
MCHC: 33 g/dL (ref 30.0–36.0)
MCV: 81.8 fL (ref 80.0–100.0)
Platelets: 179 10*3/uL (ref 150–400)
RBC: 4.34 MIL/uL (ref 3.87–5.11)
RDW: 15.6 % — ABNORMAL HIGH (ref 11.5–15.5)
WBC: 2.2 10*3/uL — ABNORMAL LOW (ref 4.0–10.5)
nRBC: 0 % (ref 0.0–0.2)

## 2020-03-10 LAB — I-STAT BETA HCG BLOOD, ED (MC, WL, AP ONLY): I-stat hCG, quantitative: <5 m[IU]/mL (ref <5)

## 2020-03-10 LAB — LIPASE, BLOOD: Lipase: 27 U/L (ref 11–51)

## 2020-03-10 LAB — MAGNESIUM: Magnesium: 1.6 mg/dL — ABNORMAL LOW (ref 1.7–2.4)

## 2020-03-10 MED ORDER — POTASSIUM CHLORIDE ER 10 MEQ PO TBCR: 10.0000 meq | EXTENDED_RELEASE_TABLET | Freq: Two times a day (BID) | ORAL | 0 refills | Status: DC

## 2020-03-10 NOTE — ED Notes (Signed)
Discharge instructions discussed with patient. Verbalized understanding. Prescriptions and follow up care discussed. Departs ED at this time in stable condition.

## 2020-03-10 NOTE — Discharge Instructions (Addendum)
Test Results for COVID-19 pending  You have a test pending for COVID-19.  Results typically return within about 48 hours.  Be sure to check MyChart for updated results.  We recommend isolating yourself until results are received.  Patients who have symptoms consistent with COVID-19 should self isolated for: At least 3 days (72 hours) have passed since recovery, defined as resolution of fever without the use of fever reducing medications and improvement in respiratory symptoms (e.g., cough, shortness of breath), and At least 7 days have passed since symptoms first appeared.  If you have no symptoms, but your test returns positive, recommend isolating for at least 10 days.   There was evidence of a lower than normal white blood cell count.  This should be monitored and reevaluated by your primary care provider.  Your potassium was slightly lower than normal.  Many times, this can be corrected through dietary changes.  Your magnesium was also slightly lower than normal.  This should be further evaluated and managed by your primary care provider.

## 2020-03-10 NOTE — ED Provider Notes (Signed)
[facility]-EMERGENCY DEPT Provider Note   CSN: [id number] Arrival date & time: 03/10/20  1544     History Chief Complaint  Patient presents with  . Headache  . leg cramps  . Abdominal Pain    Sherry Jimenez is a 39 y.o. female.  HPI      Sherry Jimenez is a 39 y.o. female, with a history of fibromyalgia, HTN, hypokalemia, presenting to the ED with generalized body aches for the last week.  This has happened previously, most recently a few months ago, and was found to be hypokalemic. States this feels similar to that episode.  She also notes that of her children was exposed to Covid recently. She is no longer taking medications for hypertension. Denies fever/chills, shortness of breath, chest pain, dizziness, weakness, syncope, cough, N/V/D, abdominal pain, urinary symptoms, rash, or any other complaints.  Past Medical History:  Diagnosis Date  . Anemia   . BV (bacterial vaginosis)   . Cholelithiasis   . Fibromyalgia   . Gestational diabetes    glyburide  . Hypertension   . Hypokalemia   . UTI (lower urinary tract infection)   . Vaginal yeast infection     Patient Active Problem List   Diagnosis Date Noted  . Cough 08/05/2016  . Gestational diabetes mellitus (GDM) affecting pregnancy, antepartum 12/22/2015  . Obesity affecting pregnancy, antepartum 10/09/2015  . Supervision of high risk pregnancy, antepartum 07/17/2015  . Advanced maternal age in multigravida 07/17/2015  . 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 Fibromyalgia 05/29/2015  . Cannabis abuse 10/21/2014  . Essential hypertension, benign 03/23/2012  . Obesity 10/19/2010    Past Surgical History:  Procedure Laterality Date  . CHOLECYSTECTOMY    . TONSILECTOMY, ADENOIDECTOMY, BILATERAL MYRINGOTOMY AND TUBES    . TUBAL LIGATION N/A 02/06/2016   Procedure: POST PARTUM TUBAL LIGATION;  Surgeon: Hermina Staggers, MD;  Location: [facility];  Service:  Gynecology;  Laterality: N/A;     OB History    Gravida  5   Para  4   Term  4   Preterm      AB  1   Living  4     SAB  1   TAB      Ectopic      Multiple  0   Live Births  4           Family History  Problem Relation Age of Onset  . Diabetes Father   . Alcohol abuse Father   . Hypertension Father     Social History   Tobacco Use  . Smoking status: Former Smoker    Packs/day: 0.25    Years: 12.00    Pack years: 3.00    Types: Cigarettes    Quit date: 03/31/2015    Years since quitting: 4.9  . Smokeless tobacco: Never Used  . Tobacco comment: electronic cigarette  Vaping Use  . Vaping Use: Never used  Substance Use Topics  . Alcohol use: Yes    Comment: social  . Drug use: No    Home Medications Prior to Admission medications   Medication Sig Start Date End Date Taking? Authorizing Provider  acetaminophen (TYLENOL) 325 MG tablet Take 2 tablets (650 mg total) by mouth every 4 (four) hours as needed (for pain scale < 4). Patient not taking: Reported on 03/23/2016 02/07/16   Arabella Merles, CNM  cetirizine (ZYRTEC) 10 MG tablet Take 1 tablet (10 mg total) by  mouth daily. 04/29/17   Diallo, Lilia Argue, MD  citalopram (CELEXA) 20 MG tablet TAKE 1 TABLET BY MOUTH EVERY DAY 04/26/17   Diallo, Lilia Argue, MD  clotrimazole (LOTRIMIN) 1 % cream Please specify directions, refills and quantity 02/01/18   Diallo, Abdoulaye, MD  Clotrimazole 1 % OINT Apply to affected areas two times a day 02/01/18   Diallo, Lilia Argue, MD  cyclobenzaprine (FLEXERIL) 5 MG tablet Take 1 tablet (5 mg total) by mouth as needed for muscle spasms. 11/26/16   Diallo, Lilia Argue, MD  ferrous sulfate 325 (65 FE) MG tablet Take 1 tablet (325 mg total) by mouth 2 (two) times daily with a meal. 02/07/16   Arabella Merles, CNM  fluticasone (FLONASE) 50 MCG/ACT nasal spray Place 2 sprays into both nostrils daily. 10/09/15   Federico Flake, MD  hydrochlorothiazide (HYDRODIURIL) 25 MG tablet  TAKE 1 TABLET BY MOUTH EVERY DAY 08/28/18   Diallo, Lilia Argue, MD  HYDROmorphone (DILAUDID) 2 MG tablet Take 1 tablet (2 mg total) by mouth every 4 (four) hours as needed for moderate pain or severe pain. Patient not taking: Reported on 03/23/2016 02/07/16   Arabella Merles, CNM  lisinopril (PRINIVIL,ZESTRIL) 40 MG tablet Take 1 tablet (40 mg total) by mouth daily. 09/05/17   Diallo, Lilia Argue, MD  nortriptyline (PAMELOR) 50 MG capsule Take 1 capsule (50 mg total) by mouth at bedtime. 04/05/16   Diallo, Lilia Argue, MD  potassium chloride (KLOR-CON) 10 MEQ tablet Take 1 tablet (10 mEq total) by mouth 2 (two) times daily for 5 days. 03/10/20 03/15/20  Aleister Lady C, PA-C  pregabalin (LYRICA) 75 MG capsule Take 1 capsule (75 mg total) by mouth 2 (two) times daily. 11/15/16   Diallo, Lilia Argue, MD  Prenatal Multivit-Min-Fe-FA (PRENATAL VITAMINS) 0.8 MG tablet Take 1 tablet by mouth daily. 10/09/15   Federico Flake, MD  traMADol (ULTRAM) 50 MG tablet TAKE 1 TABLET (50 MG TOTAL) BY MOUTH AS NEEDED. 08/08/18   Diallo, Lilia Argue, MD  zolpidem (AMBIEN) 10 MG tablet Take 1/2 to 1 tablet at night for sleep Patient not taking: Reported on 03/23/2016 01/13/16   Aviva Signs, CNM  zolpidem (AMBIEN) 5 MG tablet Take 1 tablet (5 mg total) by mouth at bedtime as needed for sleep. Patient not taking: Reported on 03/23/2016 02/07/16   Arabella Merles, CNM  belladonna-PHENObarbital [facility]) 16.2 MG/5ML ELIX Take 10 mLs (32.4 mg total) by mouth 4 (four) times daily as needed for cramping. 07/12/11 09/07/11  Reuben Likes, MD    Allergies    Azithromycin and Penicillins  Review of Systems   Review of Systems  Constitutional: Negative for chills, diaphoresis and fever.  Respiratory: Negative for cough and shortness of breath.   Cardiovascular: Negative for chest pain.  Gastrointestinal: Negative for abdominal pain, diarrhea, nausea and vomiting.  Genitourinary: Negative for dysuria, flank pain and hematuria.    Musculoskeletal: Positive for myalgias.  Neurological: Negative for syncope, weakness and numbness.  All other systems reviewed and are negative.   Physical Exam Updated Vital Signs BP (!) 174/122 (BP Location: Right Arm)   Pulse 91   Temp 99.3 F (37.4 C) (Oral)   Resp 20   Ht 5\' 3"  (1.6 m)   Wt 86.2 kg   SpO2 100%   BMI 33.66 kg/m   Physical Exam Vitals and nursing note reviewed.  Constitutional:      General: She is not in acute distress.    Appearance: She is well-developed. She is not diaphoretic.  HENT:  Head: Normocephalic and atraumatic.     Mouth/Throat:     Mouth: Mucous membranes are moist.     Pharynx: Oropharynx is clear.  Eyes:     Conjunctiva/sclera: Conjunctivae normal.  Cardiovascular:     Rate and Rhythm: Normal rate and regular rhythm.     Pulses: Normal pulses.          Radial pulses are 2+ on the right side and 2+ on the left side.       Posterior tibial pulses are 2+ on the right side and 2+ on the left side.     Heart sounds: Normal heart sounds.     Comments: Tactile temperature in the extremities appropriate and equal bilaterally. Pulmonary:     Effort: Pulmonary effort is normal. No respiratory distress.     Breath sounds: Normal breath sounds.  Abdominal:     Palpations: Abdomen is soft.     Tenderness: There is no abdominal tenderness. There is no guarding.  Musculoskeletal:     Cervical back: Neck supple.     Right lower leg: No edema.     Left lower leg: No edema.     Comments: No tenderness, swelling, increased warmth, pain with range of motion, or color abnormalities to the patient's extremities or joints.  Lymphadenopathy:     Cervical: No cervical adenopathy.  Skin:    General: Skin is warm and dry.  Neurological:     Mental Status: She is alert.  Psychiatric:        Mood and Affect: Mood and affect normal.        Speech: Speech normal.        Behavior: Behavior normal.     ED Results / Procedures / Treatments    Labs (all labs ordered are listed, but only abnormal results are displayed) Labs Reviewed      Result Value  COMPREHENSIVE METABOLIC PANEL - Abnormal; Notable for the following components:   Potassium 3.4 (*)    BUN <5 (*)    All other components within normal limits  CBC - Abnormal; Notable for the following components:   WBC 2.2 (*)    Hemoglobin 11.7 (*)    HCT 35.5 (*)    RDW 15.6 (*)    All other components within normal limits  URINALYSIS, ROUTINE W REFLEX MICROSCOPIC - Abnormal; Notable for the following components:   APPearance HAZY (*)    Hgb urine dipstick SMALL (*)    Bacteria, UA FEW (*)    All other components within normal limits  MAGNESIUM - Abnormal; Notable for the following components:   Magnesium 1.6 (*)    All other components within normal limits  LIPASE, BLOOD  I-STAT BETA HCG BLOOD, ED ([facility], [facility], [facility] ONLY)    Hemoglobin  Date Value Ref Range Status  03/10/2020 11.7 (L) 12.0 - 15.0 g/dL Final  69/62/952812/21/2018 41.311.7 11.1 - 15.9 g/dL Final  24/40/102709/28/2017 25.310.8 (L) 12.0 - 15.0 g/dL Final  66/44/034709/28/2017 42.510.8 (L) 12.0 - 15.0 g/dL Final  95/63/875607/17/2017 43.310.6 (L) 11.7 - 15.5 g/dL Final    EKG None  Radiology DG Chest 2 View  Result Date: 03/10/2020 CLINICAL DATA:  Leukopenia, abdominal pain.  Hypertension. EXAM: CHEST - 2 VIEW COMPARISON:  None. FINDINGS: The heart size and mediastinal contours are within normal limits. Both lungs are clear. The visualized skeletal structures are unremarkable. IMPRESSION: No active cardiopulmonary disease. Electronically Signed   By: Charlett NoseKevin  Dover M.D.   On: 03/10/2020 19:41  Procedures Procedures (including critical care time)  Medications Ordered in ED Medications - No data to display  ED Course  I have reviewed the triage vital signs and the nursing notes.  Pertinent labs & imaging results that were available during my care of the patient were reviewed by me and considered in my medical decision making (see chart for details).     MDM Rules/Calculators/A&P                          Patient presents with generalized body aches as her chief complaint. Patient is nontoxic appearing, afebrile, not tachycardic, not tachypneic, not hypotensive, maintains excellent SPO2 on room air, and is in no apparent distress.   I have reviewed the patient's chart to obtain more information.   I reviewed and interpreted the patient's labs and radiological studies. Very mild hypokalemia and hypomagnesemia.  These abnormalities were discussed with the patient.  I do not think they are severe enough to be causing her symptoms. I do have a suspicion for Covid infection.  This would certainly explain her leukopenia.  Covid test pending at time of discharge. She has an appointment scheduled with her PCP tomorrow.  The patient was given instructions for home care as well as return precautions. Patient voices understanding of these instructions, accepts the plan, and is comfortable with discharge.   Vitals:   03/10/20 1800 03/10/20 1830 03/10/20 1845 03/10/20 2030  BP: (!) 181/93 (!) 182/100 (!) 179/125 (!) 163/108  Pulse: 72 (!) 59 74 74  Resp: 18  16 16   Temp:    98.6 F (37 C)  TempSrc:    Oral  SpO2: 97% 100% 97% 100%  Weight:      Height:       We discussed for the patient to follow-up with her PCP regarding her hypertension.  Doubt hypertensive emergency.  Final Clinical Impression(s) / ED Diagnoses Final diagnoses:  Body aches  Leukopenia, unspecified type  Hypokalemia  Hypomagnesemia    Rx / DC Orders ED Discharge Orders         Ordered    potassium chloride (KLOR-CON) 10 MEQ tablet  2 times daily        03/10/20 1905           13/01/21 03/11/20 0053    13/02/21, MD 03/11/20 1149

## 2020-03-10 NOTE — ED Triage Notes (Addendum)
Patient c/o headache this AM Patient was hypertensive in triage-174/122.  Patient has a history of hypokalemia. Patient states she was taken off of HCTZ approx 2 months ago. Patient states she is having leg and arm cramps and abdominal pain. Patient states it feels like it did when she had hypokalemia.

## 2020-03-11 ENCOUNTER — Ambulatory Visit (HOSPITAL_COMMUNITY)
Admission: RE | Admit: 2020-03-11 | Discharge: 2020-03-11 | Disposition: A | Discharge status: Home / Self Care | Payer: Medicaid Other | Source: Ambulatory Visit | Attending: Pulmonary Disease | Admitting: Pulmonary Disease

## 2020-03-11 ENCOUNTER — Other Ambulatory Visit: Payer: Self-pay | Admitting: Nurse Practitioner

## 2020-03-11 DIAGNOSIS — U071 COVID-19: Secondary | ICD-10-CM

## 2020-03-11 MED ORDER — SOTROVIMAB 500 MG/8ML IV SOLN
500.0000 mg | Freq: Once | INTRAVENOUS | Status: AC
  Administered 2020-03-11: 500 mg via INTRAVENOUS

## 2020-03-11 MED ORDER — FAMOTIDINE IN NACL 20-0.9 MG/50ML-% IV SOLN: 20.0000 mg | Freq: Once | INTRAVENOUS | Status: DC | PRN

## 2020-03-11 MED ORDER — SODIUM CHLORIDE 0.9 % IV SOLN: INTRAVENOUS | Status: DC | PRN

## 2020-03-11 MED ORDER — ALBUTEROL SULFATE HFA 108 (90 BASE) MCG/ACT IN AERS: 2.0000 | INHALATION_SPRAY | Freq: Once | RESPIRATORY_TRACT | Status: DC | PRN

## 2020-03-11 MED ORDER — METHYLPREDNISOLONE SODIUM SUCC 125 MG IJ SOLR: 125.0000 mg | Freq: Once | INTRAMUSCULAR | Status: DC | PRN

## 2020-03-11 MED ORDER — EPINEPHRINE 0.3 MG/0.3ML IJ SOAJ: 0.3000 mg | Freq: Once | INTRAMUSCULAR | Status: DC | PRN

## 2020-03-11 MED ORDER — DIPHENHYDRAMINE HCL 50 MG/ML IJ SOLN: 50.0000 mg | Freq: Once | INTRAMUSCULAR | Status: DC | PRN

## 2020-03-11 NOTE — Progress Notes (Signed)
  Diagnosis: COVID-19  Physician:dr wright  Procedure: sotrovimab  Complications: No immediate complications noted.  Discharge: Discharged home   Sherry Jimenez [name] 03/11/2020  

## 2020-03-11 NOTE — Discharge Instructions (Signed)
COVID-19 COVID-19 is a respiratory infection that is caused by a virus called severe acute respiratory syndrome coronavirus 2 (SARS-CoV-2). The disease is also known as coronavirus disease or novel coronavirus. In some people, the virus may not cause any symptoms. In others, it may cause a serious infection. The infection can get worse quickly and can lead to complications, such as:  Pneumonia, or infection of the lungs.  Acute respiratory distress syndrome or ARDS. This is a condition in which fluid build-up in the lungs prevents the lungs from filling with air and passing oxygen into the blood.  Acute respiratory failure. This is a condition in which there is not enough oxygen passing from the lungs to the body or when carbon dioxide is not passing from the lungs out of the body.  Sepsis or septic shock. This is a serious bodily reaction to an infection.  Blood clotting problems.  Secondary infections due to bacteria or fungus.  Organ failure. This is when your body's organs stop working. The virus that causes COVID-19 is contagious. This means that it can spread from person to person through droplets from coughs and sneezes (respiratory secretions). What are the causes? This illness is caused by a virus. You may catch the virus by:  Breathing in droplets from an infected person. Droplets can be spread by a person breathing, speaking, singing, coughing, or sneezing.  Touching something, like a table or a doorknob, that was exposed to the virus (contaminated) and then touching your mouth, nose, or eyes. What increases the risk? Risk for infection You are more likely to be infected with this virus if you:  Are within 6 feet (2 meters) of a person with COVID-19.  Provide care for or live with a person who is infected with COVID-19.  Spend time in crowded indoor spaces or live in shared housing. Risk for serious illness You are more likely to become seriously ill from the virus if  you:  Are 50 years of age or older. The higher your age, the more you are at risk for serious illness.  Live in a nursing home or long-term care facility.  Have cancer.  Have a long-term (chronic) disease such as: ? Chronic lung disease, including chronic obstructive pulmonary disease or asthma. ? A long-term disease that lowers your body's ability to fight infection (immunocompromised). ? Heart disease, including heart failure, a condition in which the arteries that lead to the heart become narrow or blocked (coronary artery disease), a disease which makes the heart muscle thick, weak, or stiff (cardiomyopathy). ? Diabetes. ? Chronic kidney disease. ? Sickle cell disease, a condition in which red blood cells have an abnormal "sickle" shape. ? Liver disease.  Are obese. What are the signs or symptoms? Symptoms of this condition can range from mild to severe. Symptoms may appear any time from 2 to 14 days after being exposed to the virus. They include:  A fever or chills.  A cough.  Difficulty breathing.  Headaches, body aches, or muscle aches.  Runny or stuffy (congested) nose.  A sore throat.  New loss of taste or smell. Some people may also have stomach problems, such as nausea, vomiting, or diarrhea. Other people may not have any symptoms of COVID-19. How is this diagnosed? This condition may be diagnosed based on:  Your signs and symptoms, especially if: ? You live in an area with a COVID-19 outbreak. ? You recently traveled to or from an area where the virus is common. ? You   provide care for or live with a person who was diagnosed with COVID-19. ? You were exposed to a person who was diagnosed with COVID-19.  A physical exam.  Lab tests, which may include: ? Taking a sample of fluid from the back of your nose and throat (nasopharyngeal fluid), your nose, or your throat using a swab. ? A sample of mucus from your lungs (sputum). ? Blood tests.  Imaging tests,  which may include, X-rays, CT scan, or ultrasound. How is this treated? At present, there is no medicine to treat COVID-19. Medicines that treat other diseases are being used on a trial basis to see if they are effective against COVID-19. Your health care provider will talk with you about ways to treat your symptoms. For most people, the infection is mild and can be managed at home with rest, fluids, and over-the-counter medicines. Treatment for a serious infection usually takes places in a hospital intensive care unit (ICU). It may include one or more of the following treatments. These treatments are given until your symptoms improve.  Receiving fluids and medicines through an IV.  Supplemental oxygen. Extra oxygen is given through a tube in the nose, a face mask, or a hood.  Positioning you to lie on your stomach (prone position). This makes it easier for oxygen to get into the lungs.  Continuous positive airway pressure (CPAP) or bi-level positive airway pressure (BPAP) machine. This treatment uses mild air pressure to keep the airways open. A tube that is connected to a motor delivers oxygen to the body.  Ventilator. This treatment moves air into and out of the lungs by using a tube that is placed in your windpipe.  Tracheostomy. This is a procedure to create a hole in the neck so that a breathing tube can be inserted.  Extracorporeal membrane oxygenation (ECMO). This procedure gives the lungs a chance to recover by taking over the functions of the heart and lungs. It supplies oxygen to the body and removes carbon dioxide. Follow these instructions at home: Lifestyle  If you are sick, stay home except to get medical care. Your health care provider will tell you how long to stay home. Call your health care provider before you go for medical care.  Rest at home as told by your health care provider.  Do not use any products that contain nicotine or tobacco, such as cigarettes,  e-cigarettes, and chewing tobacco. If you need help quitting, ask your health care provider.  Return to your normal activities as told by your health care provider. Ask your health care provider what activities are safe for you. General instructions  Take over-the-counter and prescription medicines only as told by your health care provider.  Drink enough fluid to keep your urine pale yellow.  Keep all follow-up visits as told by your health care provider. This is important. How is this prevented?  There is no vaccine to help prevent COVID-19 infection. However, there are steps you can take to protect yourself and others from this virus. To protect yourself:   Do not travel to areas where COVID-19 is a risk. The areas where COVID-19 is reported change often. To identify high-risk areas and travel restrictions, check the CDC travel website: wwwnc.cdc.gov/travel/notices  If you live in, or must travel to, an area where COVID-19 is a risk, take precautions to avoid infection. ? Stay away from people who are sick. ? Wash your hands often with soap and water for 20 seconds. If soap and water   are not available, use an alcohol-based hand sanitizer. ? Avoid touching your mouth, face, eyes, or nose. ? Avoid going out in public, follow guidance from your state and local health authorities. ? If you must go out in public, wear a cloth face covering or face mask. Make sure your mask covers your nose and mouth. ? Avoid crowded indoor spaces. Stay at least 6 feet (2 meters) away from others. ? Disinfect objects and surfaces that are frequently touched every day. This may include:  Counters and tables.  Doorknobs and light switches.  Sinks and faucets.  Electronics, such as phones, remote controls, keyboards, computers, and tablets. To protect others: If you have symptoms of COVID-19, take steps to prevent the virus from spreading to others.  If you think you have a COVID-19 infection, contact  your health care provider right away. Tell your health care team that you think you may have a COVID-19 infection.  Stay home. Leave your house only to seek medical care. Do not use public transport.  Do not travel while you are sick.  Wash your hands often with soap and water for 20 seconds. If soap and water are not available, use alcohol-based hand sanitizer.  Stay away from other members of your household. Let healthy household members care for children and pets, if possible. If you have to care for children or pets, wash your hands often and wear a mask. If possible, stay in your own room, separate from others. Use a different bathroom.  Make sure that all people in your household wash their hands well and often.  Cough or sneeze into a tissue or your sleeve or elbow. Do not cough or sneeze into your hand or into the air.  Wear a cloth face covering or face mask. Make sure your mask covers your nose and mouth. Where to find more information  Centers for Disease Control and Prevention: www.cdc.gov/coronavirus/2019-ncov/index.html  World Health Organization: www.who.int/health-topics/coronavirus Contact a health care provider if:  You live in or have traveled to an area where COVID-19 is a risk and you have symptoms of the infection.  You have had contact with someone who has COVID-19 and you have symptoms of the infection. Get help right away if:  You have trouble breathing.  You have pain or pressure in your chest.  You have confusion.  You have bluish lips and fingernails.  You have difficulty waking from sleep.  You have symptoms that get worse. These symptoms may represent a serious problem that is an emergency. Do not wait to see if the symptoms will go away. Get medical help right away. Call your local emergency services (911 in the [place] Do not drive yourself to the hospital. Let the emergency medical personnel know if you think you have  COVID-19. Summary  COVID-19 is a respiratory infection that is caused by a virus. It is also known as coronavirus disease or novel coronavirus. It can cause serious infections, such as pneumonia, acute respiratory distress syndrome, acute respiratory failure, or sepsis.  The virus that causes COVID-19 is contagious. This means that it can spread from person to person through droplets from breathing, speaking, singing, coughing, or sneezing.  You are more likely to develop a serious illness if you are 50 years of age or older, have a weak immune system, live in a nursing home, or have chronic disease.  There is no medicine to treat COVID-19. Your health care provider will talk with you about ways to treat your symptoms.    Take steps to protect yourself and others from infection. Wash your hands often and disinfect objects and surfaces that are frequently touched every day. Stay away from people who are sick and wear a mask if you are sick. This information is not intended to replace advice given to you by your health care provider. Make sure you discuss any questions you have with your health care provider. Document Revised: 02/23/2019 Document Reviewed: 06/01/2018 Elsevier Patient Education  2020 [employer] Inc. What types of side effects do monoclonal antibody drugs cause?  Common side effects  In general, the more common side effects caused by monoclonal antibody drugs include: . Allergic reactions, such as hives or itching . Flu-like signs and symptoms, including chills, fatigue, fever, and muscle aches and pains . Nausea, vomiting . Diarrhea . Skin rashes . Low blood pressure   The CDC is recommending patients who receive monoclonal antibody treatments wait at least 90 days before being vaccinated.  Currently, there are no data on the safety and efficacy of mRNA COVID-19 vaccines in persons who received monoclonal antibodies or convalescent plasma as part of COVID-19 treatment. Based  on the estimated half-life of such therapies as well as evidence suggesting that reinfection is uncommon in the 90 days after initial infection, vaccination should be deferred for at least 90 days, as a precautionary measure until additional information becomes available, to avoid interference of the antibody treatment with vaccine-induced immune responses. 

## 2020-03-11 NOTE — Progress Notes (Signed)
I connected by phone with Sherry Jimenez on 03/11/2020 at 11:08 AM to discuss the potential use of a treatment for mild to moderate COVID-19 viral infection in non-hospitalized patients.  This patient is a 39 y.o. female that meets the FDA criteria for Emergency Use Authorization of bamlanivimab/etesevimab, casirivimab\imdevimab, or sotrovimab  Has a (+) direct SARS-CoV-2 viral test result  Has mild or moderate COVID-19   Is ? 40 years of age and weighs ? 40 kg  Is NOT hospitalized due to COVID-19  Is NOT requiring oxygen therapy or requiring an increase in baseline oxygen flow rate due to COVID-19  Is within 10 days of symptom onset  Has at least one of the high risk factor(s) for progression to severe COVID-19 and/or hospitalization as defined in EUA.  Specific high risk criteria : BMI > 25 and Other high risk medical condition per CDC:  SVI   I have spoken and communicated the following to the patient or parent/caregiver:  1. FDA has authorized the emergency use of bamlanivimab/etesevimab, casirivimab\imdevimab, or sotrovimab for the treatment of mild to moderate COVID-19 in adults and pediatric patients with positive results of direct SARS-CoV-2 viral testing who are 50 years of age and older weighing at least 40 kg, and who are at high risk for progressing to severe COVID-19 and/or hospitalization.  2. The significant known and potential risks and benefits of bamlanivimab/etesevimab, casirivimab\imdevimab, or sotrovimab, and the extent to which such potential risks and benefits are unknown.  3. Information on available alternative treatments and the risks and benefits of those alternatives, including clinical trials.  4. Patients treated with bamlanivimab/etesevimab, casirivimab\imdevimab, or sotrovimab should continue to self-isolate and use infection control measures (e.g., wear mask, isolate, social distance, avoid sharing personal items, clean and disinfect "high touch"  surfaces, and frequent handwashing) according to CDC guidelines.   5. The patient or parent/caregiver has the option to accept or refuse bamlanivimab/etesevimab, casirivimab\imdevimab, or sotrovimab.  After reviewing this information with the patient, the patient has agreed to receive one of the available covid 19 monoclonal antibodies and will be provided an appropriate fact sheet prior to infusion.[name], DNP, AGNP-C [phone] (Infusion Center Hotline)

## 2020-05-27 ENCOUNTER — Ambulatory Visit: Payer: Medicaid Other | Admitting: Family Medicine

## 2020-06-02 NOTE — Progress Notes (Signed)
SUBJECTIVE:   CHIEF COMPLAINT / HPI:   Encounter for flu shot, TB testing, and COVID-19 vaccination: Patient is a pleasant 40 year old female who presents today for her flu shot as well as her COVID-19 vaccination.  Patient was previously COVID-19 positive on 03/10/2020.  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   Anxiety Patient states she has been under a lot of stress lately.   Hasn't been sleeping as much lately.  She has 4 kids and a busy lifestyle right now which makes things challenging for her. She has been trying to exercise daily.  She currently has an 70 year old child which she is working on trying to get moved out of her house and this caused her a lot of anxiety.  She states she also gets some chronic pain which she has been told in the past may be due to fibromyalgia but has never really improved.  At this time is not causing her a large amount of distress, however she is interested if antianxiety medication may help with her symptoms.  She denies any SI/HI.    PERTINENT  PMH / PSH: History of gestational diabetes.  OBJECTIVE:   BP 140/82   Pulse 81   Wt 195 lb 12.8 oz (88.8 kg)   SpO2 95%   BMI 34.68 kg/m    GAD 7 : Generalized Anxiety Score 06/03/2020 03/23/2016 11/24/2015  Nervous, Anxious, on Edge 3 1 0  Control/stop worrying 3 1 1   Worry too much - different things 3 1 1   Trouble relaxing 2 1 3   Restless 2 1 0  Easily annoyed or irritable 2 1 0  Afraid - awful might happen 3 1 0  Total GAD 7 Score 18 7 5   Anxiety Difficulty Somewhat difficult - -   General: NAD, pleasant, able to participate in exam Cardiac: RRR, no murmurs. Respiratory: CTAB, normal effort, No wheezes, rales or rhonchi Abdomen: Bowel sounds present, nontender, nondistended Extremities: no edema or cyanosis. Psych: Normal affect and mood  ASSESSMENT/PLAN:   Anxiety Assessment: 40 year old female with a history of anxiety,  currently under a lot of stress due to some life stressors and having 4 children 1 of which is 51 years old and she is working on trying to move out of her home.  She states that without active all of her children are this is caused a lot of stress and anxiety and she is also starting a new job.  She states that anxiety has sometimes made it difficult to sleep at night and she is interested in trying a medication to help with this.  She denies any SI/HI.  She has a elevated GAD-7 in the office today of 18. Plan: -Discussed with patient different modalities for treatment of anxiety including counseling and medications -After discussion with patient we will initiate BuSpar 7.5 mg twice daily -I will call patient in 1 week to see how she is doing -Plan for follow-up in about a month to see if we need to increase the dose of the medication and see how she is doing with it. -At follow-up we will assess if her anxiety improved and if so if her chronic, fibromyalgia pains have improved as well.  Can consider physical therapy referral, however I feel this is less beneficial at this time as the patient has started a daily exercise program   -COVID-19 vaccination, TB testing, influenza vaccination performed as above. -  Patient was informed she was due for Pap today but requested to hold off, plans to complete at next visit when she returns for TB testing read. -We will check lipid panel today to screen for hyperlipidemia  Jackelyn Poling, DO [facility]    This note was prepared using Dragon voice recognition software and may include unintentional dictation errors due to the inherent limitations of voice recognition software.

## 2020-06-02 NOTE — Patient Instructions (Signed)
It was great to see you! Thank you for allowing me to participate in your care!  Our plans for today:  -Today we discussed your symptoms of anxiety.  We will start a medication called BuSpar which I think may help with your anxiety.  I will call you in 1 week to see how you are doing.  I would like for you to make a follow-up appointment in about 1 month to discuss your anxiety and see if we need to increase the dose of the medication. -You are due for a Pap smear at this time and that would be recommended to schedule at your next appointment -We also provided your flu shot, Covid vaccine, and Tdap testing. -We are checking a cholesterol level today as well  We are checking some labs today, I will call you if they are abnormal will send you a MyChart message or a letter if they are normal.  If you do not hear about your labs in the next 2 weeks please let us know.  Take care and seek immediate care sooner if you develop any concerns.   Dr. Jackelyn Poling, DO [facility]

## 2020-06-03 ENCOUNTER — Other Ambulatory Visit: Payer: Self-pay

## 2020-06-03 ENCOUNTER — Ambulatory Visit (INDEPENDENT_AMBULATORY_CARE_PROVIDER_SITE_OTHER): Payer: Medicaid Other | Admitting: Family Medicine

## 2020-06-03 ENCOUNTER — Encounter: Payer: Self-pay | Admitting: Family Medicine

## 2020-06-03 VITALS — BP 140/82 | HR 81 | Wt 195.8 lb

## 2020-06-03 DIAGNOSIS — Z23 Encounter for immunization: Secondary | ICD-10-CM

## 2020-06-03 DIAGNOSIS — F419 Anxiety disorder, unspecified: Secondary | ICD-10-CM | POA: Insufficient documentation

## 2020-06-03 DIAGNOSIS — Z13220 Encounter for screening for lipoid disorders: Secondary | ICD-10-CM | POA: Diagnosis not present

## 2020-06-03 MED ORDER — BUSPIRONE HCL 7.5 MG PO TABS: 7.5000 mg | ORAL_TABLET | Freq: Two times a day (BID) | ORAL | 1 refills | Status: AC

## 2020-06-03 NOTE — Assessment & Plan Note (Addendum)
Assessment: 40 year old female with a history of anxiety, currently under a lot of stress due to some life stressors and having 4 children 1 of which is 72 years old and she is working on trying to move out of her home.  She states that without active all of her children are this is caused a lot of stress and anxiety and she is also starting a new job.  She states that anxiety has sometimes made it difficult to sleep at night and she is interested in trying a medication to help with this.  She denies any SI/HI.  She has a elevated GAD-7 in the office today of 18. Plan: -Discussed with patient different modalities for treatment of anxiety including counseling and medications -After discussion with patient we will initiate BuSpar 7.5 mg twice daily -I will call patient in 1 week to see how she is doing -Plan for follow-up in about a month to see if we need to increase the dose of the medication and see how she is doing with it. -At follow-up we will assess if her anxiety improved and if so if her chronic, fibromyalgia pains have improved as well.  Can consider physical therapy referral, however I feel this is less beneficial at this time as the patient has started a daily exercise program

## 2020-06-03 NOTE — Progress Notes (Signed)
   Covid-19 Vaccination Clinic  Name:  [name]    MRN: [id number] DOB: 13-Apr-1981  06/03/2020  [name] was observed post Covid-19 immunization for 15 minutes without incident. She was provided with Vaccine Information Sheet and instruction to access the V-Safe system.   [name] was instructed to call 911 with any severe reactions post vaccine: Marland Kitchen Difficulty breathing  . Swelling of face and throat  . A fast heartbeat  . A bad rash all over body  . Dizziness and weakness

## 2020-06-04 LAB — LIPID PANEL
Chol/HDL Ratio: 3.1 ratio (ref 0.0–4.4)
Cholesterol, Total: 151 mg/dL (ref 100–199)
HDL: 48 mg/dL (ref >39)
LDL Chol Calc (NIH): 90 mg/dL (ref 0–99)
Triglycerides: 65 mg/dL (ref 0–149)
VLDL Cholesterol Cal: 13 mg/dL (ref 5–40)

## 2020-06-05 ENCOUNTER — Ambulatory Visit: Payer: Medicaid Other

## 2020-06-05 ENCOUNTER — Other Ambulatory Visit: Payer: Self-pay

## 2020-06-05 DIAGNOSIS — Z23 Encounter for immunization: Secondary | ICD-10-CM | POA: Diagnosis present

## 2020-06-05 DIAGNOSIS — Z111 Encounter for screening for respiratory tuberculosis: Secondary | ICD-10-CM

## 2020-06-05 LAB — TB SKIN TEST
Induration: 0 mm
TB Skin Test: NEGATIVE

## 2020-06-05 NOTE — Addendum Note (Signed)
Addended by: Steva Colder on: 06/05/2020 03:32 PM   Modules accepted: Orders

## 2020-06-05 NOTE — Progress Notes (Signed)
PPD Reading Note  PPD read and results entered in EpicCare.  Result: 0 mm induration.  Interpretation: Negative  Allergic reaction: no

## 2020-06-24 ENCOUNTER — Ambulatory Visit: Payer: Medicaid Other

## 2020-06-30 ENCOUNTER — Ambulatory Visit (INDEPENDENT_AMBULATORY_CARE_PROVIDER_SITE_OTHER): Payer: Medicaid Other

## 2020-06-30 ENCOUNTER — Other Ambulatory Visit: Payer: Self-pay

## 2020-06-30 DIAGNOSIS — Z23 Encounter for immunization: Secondary | ICD-10-CM | POA: Diagnosis not present

## 2020-07-04 ENCOUNTER — Encounter (HOSPITAL_COMMUNITY): Payer: Self-pay | Admitting: Emergency Medicine

## 2020-07-04 ENCOUNTER — Telehealth: Payer: Self-pay

## 2020-07-04 ENCOUNTER — Ambulatory Visit (HOSPITAL_COMMUNITY)
Admission: EM | Admit: 2020-07-04 | Discharge: 2020-07-04 | Disposition: A | Discharge status: Home / Self Care | Payer: Medicaid Other | Attending: Emergency Medicine | Admitting: Emergency Medicine

## 2020-07-04 ENCOUNTER — Other Ambulatory Visit: Payer: Self-pay

## 2020-07-04 DIAGNOSIS — J309 Allergic rhinitis, unspecified: Secondary | ICD-10-CM | POA: Insufficient documentation

## 2020-07-04 DIAGNOSIS — Z881 Allergy status to other antibiotic agents status: Secondary | ICD-10-CM | POA: Diagnosis not present

## 2020-07-04 DIAGNOSIS — M797 Fibromyalgia: Secondary | ICD-10-CM | POA: Diagnosis not present

## 2020-07-04 DIAGNOSIS — T7840XA Allergy, unspecified, initial encounter: Secondary | ICD-10-CM

## 2020-07-04 DIAGNOSIS — G43809 Other migraine, not intractable, without status migrainosus: Secondary | ICD-10-CM

## 2020-07-04 DIAGNOSIS — Z8616 Personal history of COVID-19: Secondary | ICD-10-CM | POA: Diagnosis not present

## 2020-07-04 DIAGNOSIS — Z20822 Contact with and (suspected) exposure to covid-19: Secondary | ICD-10-CM | POA: Diagnosis not present

## 2020-07-04 DIAGNOSIS — Z880 Allergy status to penicillin: Secondary | ICD-10-CM | POA: Diagnosis not present

## 2020-07-04 DIAGNOSIS — M26623 Arthralgia of bilateral temporomandibular joint: Secondary | ICD-10-CM

## 2020-07-04 DIAGNOSIS — Z79899 Other long term (current) drug therapy: Secondary | ICD-10-CM | POA: Diagnosis not present

## 2020-07-04 DIAGNOSIS — R519 Headache, unspecified: Secondary | ICD-10-CM | POA: Diagnosis present

## 2020-07-04 DIAGNOSIS — I10 Essential (primary) hypertension: Secondary | ICD-10-CM | POA: Diagnosis not present

## 2020-07-04 DIAGNOSIS — Z87891 Personal history of nicotine dependence: Secondary | ICD-10-CM | POA: Insufficient documentation

## 2020-07-04 MED ORDER — FLUTICASONE PROPIONATE 50 MCG/ACT NA SUSP: 2.0000 | Freq: Every day | NASAL | 0 refills | Status: AC

## 2020-07-04 MED ORDER — KETOROLAC TROMETHAMINE 30 MG/ML IJ SOLN
30.0000 mg | Freq: Once | INTRAMUSCULAR | Status: AC
  Administered 2020-07-04: 30 mg via INTRAMUSCULAR

## 2020-07-04 MED ORDER — CYCLOBENZAPRINE HCL 10 MG PO TABS: 10.0000 mg | ORAL_TABLET | Freq: Every day | ORAL | 0 refills | Status: AC

## 2020-07-04 MED ORDER — DIPHENHYDRAMINE HCL 25 MG PO CAPS
ORAL_CAPSULE | ORAL | Status: AC
  Filled 2020-07-04: qty 2

## 2020-07-04 MED ORDER — ONDANSETRON 8 MG PO TBDP: ORAL_TABLET | ORAL | 0 refills | Status: AC

## 2020-07-04 MED ORDER — DEXAMETHASONE SODIUM PHOSPHATE 10 MG/ML IJ SOLN
10.0000 mg | Freq: Once | INTRAMUSCULAR | Status: AC
  Administered 2020-07-04: 10 mg via INTRAMUSCULAR

## 2020-07-04 MED ORDER — ACETAMINOPHEN 325 MG PO TABS
975.0000 mg | ORAL_TABLET | Freq: Once | ORAL | Status: AC
  Administered 2020-07-04: 975 mg via ORAL

## 2020-07-04 MED ORDER — IBUPROFEN 600 MG PO TABS: 600.0000 mg | ORAL_TABLET | Freq: Four times a day (QID) | ORAL | 0 refills | Status: AC | PRN

## 2020-07-04 MED ORDER — DIPHENHYDRAMINE HCL 25 MG PO CAPS
50.0000 mg | ORAL_CAPSULE | Freq: Once | ORAL | Status: AC
  Administered 2020-07-04: 50 mg via ORAL

## 2020-07-04 MED ORDER — AMLODIPINE BESYLATE 2.5 MG PO TABS: 2.5000 mg | ORAL_TABLET | Freq: Every day | ORAL | 0 refills | Status: AC

## 2020-07-04 MED ORDER — KETOROLAC TROMETHAMINE 30 MG/ML IJ SOLN
INTRAMUSCULAR | Status: AC
  Filled 2020-07-04: qty 1

## 2020-07-04 MED ORDER — ACETAMINOPHEN 325 MG PO TABS
ORAL_TABLET | ORAL | Status: AC
  Filled 2020-07-04: qty 3

## 2020-07-04 MED ORDER — DEXAMETHASONE SODIUM PHOSPHATE 10 MG/ML IJ SOLN
INTRAMUSCULAR | Status: AC
  Filled 2020-07-04: qty 1

## 2020-07-04 MED ORDER — METOCLOPRAMIDE HCL 5 MG/ML IJ SOLN
INTRAMUSCULAR | Status: AC
  Filled 2020-07-04: qty 2

## 2020-07-04 MED ORDER — METOCLOPRAMIDE HCL 5 MG/ML IJ SOLN
10.0000 mg | Freq: Once | INTRAMUSCULAR | Status: AC
  Administered 2020-07-04: 10 mg via INTRAMUSCULAR

## 2020-07-04 NOTE — ED Triage Notes (Signed)
Pt presents with N, V, dizziness, headache, and hypertension. States has hx of HBP. States symptoms started 2 days aog, but was worse this am when getting off work.

## 2020-07-04 NOTE — ED Provider Notes (Signed)
HPI  SUBJECTIVE:  Sherry QuinonesShanita S Jimenez is a 40 y.o. female who reports a gradual onset constant throbbing frontal headache accompanied with nausea, vomiting, photophobia for 2 days. She got the second dose of the COVID vaccine 4 days ago. She states that her blood pressure has been high, measuring 160s to 170s/100 during this time when she checks it. She reports body aches, nasal congestion, clear copious rhinorrhea, ear pain, generalized weakness. States that she is has been unable to tolerate  p.o. for the past 24 hours although she is keeping small amounts of fluids down. She states this headache is similar to when she had COVID, but it also feels similar to previous migraines. She has tried Tylenol, Excedrin, Aleve, ibuprofen. Symptoms are better with being in a quiet room. Symptoms are worse with movement, activity. Last exposure to Covid was 1 to 2 weeks ago. No fevers, visual changes,  sinus pain/pressure,  jaw pain, dental pain,  dysarthria, focal weakness, facial droop, discoordination. No neck stiffness, rash. No seizures, syncope. No sudden onset, did not occur during exertion. She has a past medical history of hypertension and is not on any medications now. She has a history of fibromyalgia, TMJ arthralgia, migraines, allergies, strep status post tonsillectomy, otitis media, gestational diabetes. She denies marijuana use. LMP: Now. Denies possibility being pregnant. [facility] She has follow-up with them in 3 days.  Past Medical History:  Diagnosis Date  . Anemia   . BV (bacterial vaginosis)   . Cholelithiasis   . Fibromyalgia   . Gestational diabetes    glyburide  . Gestational diabetes mellitus (GDM) affecting pregnancy, antepartum 12/22/2015  . Hypertension   . Hypokalemia   . UTI (lower urinary tract infection)   . Vaginal yeast infection     Past Surgical History:  Procedure Laterality Date  . CHOLECYSTECTOMY    . TONSILECTOMY, ADENOIDECTOMY,  BILATERAL MYRINGOTOMY AND TUBES    . TUBAL LIGATION N/A 02/06/2016   Procedure: POST PARTUM TUBAL LIGATION;  Surgeon: Hermina StaggersMichael L Ervin, MD;  Location: [facility];  Service: Gynecology;  Laterality: N/A;    Family History  Problem Relation Age of Onset  . Diabetes Father   . Alcohol abuse Father   . Hypertension Father     Social History   Tobacco Use  . Smoking status: Former Smoker    Packs/day: 0.25    Years: 12.00    Pack years: 3.00    Types: Cigarettes    Quit date: 03/31/2015    Years since quitting: 5.2  . Smokeless tobacco: Never Used  . Tobacco comment: electronic cigarette  Vaping Use  . Vaping Use: Never used  Substance Use Topics  . Alcohol use: Yes    Comment: social  . Drug use: No    No current facility-administered medications for this encounter.  Current Outpatient Medications:  .  amLODipine (NORVASC) 2.5 MG tablet, Take 1 tablet (2.5 mg total) by mouth daily., Disp: 15 tablet, Rfl: 0 .  cyclobenzaprine (FLEXERIL) 10 MG tablet, Take 1 tablet (10 mg total) by mouth at bedtime., Disp: 20 tablet, Rfl: 0 .  fluticasone (FLONASE) 50 MCG/ACT nasal spray, Place 2 sprays into both nostrils daily., Disp: 16 g, Rfl: 0 .  ibuprofen (ADVIL) 600 MG tablet, Take 1 tablet (600 mg total) by mouth every 6 (six) hours as needed., Disp: 30 tablet, Rfl: 0 .  ondansetron (ZOFRAN ODT) 8 MG disintegrating tablet, 1/2- 1 tablet q 8 hr prn nausea, vomiting, Disp: 20  tablet, Rfl: 0 .  busPIRone (BUSPAR) 7.5 MG tablet, Take 1 tablet (7.5 mg total) by mouth 2 (two) times daily., Disp: 60 tablet, Rfl: 1 .  cetirizine (ZYRTEC) 10 MG tablet, Take 1 tablet (10 mg total) by mouth daily. (Patient not taking: Reported on 06/03/2020), Disp: 30 tablet, Rfl: 11 .  Clotrimazole 1 % OINT, Apply to affected areas two times a day, Disp: 56.7 g, Rfl: 0  Allergies  Allergen Reactions  . Azithromycin Other (See Comments)    Vomiting   . Penicillins Nausea And Vomiting    Has patient had a PCN reaction  causing immediate rash, facial/tongue/throat swelling, SOB or lightheadedness with hypotension: No Has patient had a PCN reaction causing severe rash involving mucus membranes or skin necrosis: No Has patient had a PCN reaction that required hospitalization No Has patient had a PCN reaction occurring within the last 10 years: No If all of the above answers are "NO", then may proceed with Cephalosporin use.     ROS  As noted in HPI.   Physical Exam  BP (!) 146/92 (BP Location: Right Arm)   Pulse 75   Temp 98.4 F (36.9 C) (Oral)   Resp 19   SpO2 100%    BP Readings from Last 3 Encounters:  07/04/20 (!) 146/92  06/03/20 140/82  03/11/20 (!) 160/112     Constitutional: Well developed, well nourished, no acute distress Eyes: PERRL, EOMI, conjunctiva normal bilaterally.  Mild bilateral photophobia. HENT: Normocephalic, atraumatic,mucus membranes moist, normal dentition.  TM normal b/l.  Bilateral TMJ tenderness.   Positive clear nasal congestion, positive maxillary, frontal sinus tenderness. No temporal artery tenderness.  Neck: Positive left-sided tender cervical LN + bilateral trapezial muscle tenderness. No meningismus Respiratory: normal inspiratory effort Cardiovascular: Normal rate, regular rhythm GI:  nondistended skin: No rash, skin intact Musculoskeletal: No edema, no tenderness, no deformities Neurologic: Alert & oriented x 3, CN III-XII intact, romberg neg, finger-> nose, heel-> shin equal b/l, Romberg neg, tandem gait steady Psychiatric: Speech and behavior appropriate   ED Course   Medications  ketorolac (TORADOL) 30 MG/ML injection 30 mg (30 mg Intramuscular Given 07/04/20 1809)  acetaminophen (TYLENOL) tablet 975 mg (975 mg Oral Given 07/04/20 1806)  dexamethasone (DECADRON) injection 10 mg (10 mg Intramuscular Given 07/04/20 1809)  metoCLOPramide (REGLAN) injection 10 mg (10 mg Intramuscular Given 07/04/20 1809)  diphenhydrAMINE (BENADRYL) capsule 50 mg (50 mg  Oral Given 07/04/20 1807)    Orders Placed This Encounter  Procedures  . SARS CORONAVIRUS 2 (TAT 6-24 HRS) Nasopharyngeal Nasopharyngeal Swab    Standing Status:   Standing    Number of Occurrences:   1    Order Specific Question:   Is this test for diagnosis or screening    Answer:   Diagnosis of ill patient    Order Specific Question:   Symptomatic for COVID-19 as defined by CDC    Answer:   Yes    Order Specific Question:   Date of Symptom Onset    Answer:   07/01/2020    Order Specific Question:   Hospitalized for COVID-19    Answer:   No    Order Specific Question:   Admitted to ICU for COVID-19    Answer:   No    Order Specific Question:   Previously tested for COVID-19    Answer:   Yes    Order Specific Question:   Resident in a congregate (group) care setting    Answer:   No  Order Specific Question:   Employed in healthcare setting    Answer:   Yes    Order Specific Question:   Pregnant    Answer:   No    Order Specific Question:   Has patient completed COVID vaccination(s) (2 doses of Pfizer/Moderna 1 dose of [employer])    Answer:   Yes    Order Specific Question:   Has patient completed COVID Booster / 3rd dose    Answer:   No   Results for orders placed or performed during the hospital encounter of 07/04/20 (from the past 24 hour(s))  SARS CORONAVIRUS 2 (TAT 6-24 HRS) Nasopharyngeal Nasopharyngeal Swab     Status: None   Collection Time: 07/04/20  5:45 PM   Specimen: Nasopharyngeal Swab  Result Value Ref Range   SARS Coronavirus 2 NEGATIVE NEGATIVE   No results found.   ED Clinical Impression  1. Other migraine without status migrainosus, not intractable   2. Bilateral temporomandibular joint pain   3. Allergy, initial encounter     ED Assessment/Plan  Blood pressures comparable to last visit, and improved from the visit before.  Pt describing typical pain, no sudden onset. Doubt SAH, ICH or space occupying lesion. Pt without fevers/chills, Pt  has no meningeal sx, no nuchal rigidity. Doubt meningitis. Pt with normal neuro exam, no evidence of CVA/TIA.  Pt BP not elevated significantly, doubt hypertensive emergency. No evidence of temporal artery tenderness, no evidence of glaucoma or other ocular pathology. Will give headache cocktail (dexamethasone 10 IM, Reglan 10 IM, toradol 30 IM, benadryl 50 po ),  and reassess.  Patient has multiple possibilities causing her headache.  Could be a migraine, treating with migraine cocktail.  Could also be her TMJ, so will send home with Tylenol/ibuprofen, Flexeril 10 mg nightly and a soft diet for the next few days.  Could be her allergies/sinuses, however she has no indications for antibiotics or purulent nasal drainage.  Home with Flonase, saline nasal irrigation, restarting antihistamine of her choice.  Could be musculoskeletal as she has bilateral trapezial tenderness.  Tylenol/ibuprofen/Flexeril.  Advised continue pushing electrolyte containing fluids.  will also send home with Zofran.  She has follow-up with her primary care physician in 3 days.    Patient is very concerned about her blood pressure, so we will send home with Norvasc 2.5 mg daily if her blood pressure gets consistently high.  States that she had to get repeated potassium infusions when she was on hydrochlorothiazide.  She has a blood pressure monitor at home. If her headache resolves and she remains normotensive, she will not start the Norvasc unless instructed to do so by her PMD.  Work note for [employer] and tomorrow.  Pt improving after medications. Pt with continued non-focal neuro exam.  Discussed  MDM, plan for follow up, signs and sx that should prompt return to ER. Pt agrees with plan,  Meds ordered this encounter  Medications  . ketorolac (TORADOL) 30 MG/ML injection 30 mg  . acetaminophen (TYLENOL) tablet 975 mg  . dexamethasone (DECADRON) injection 10 mg  . metoCLOPramide (REGLAN) injection 10 mg  . diphenhydrAMINE  (BENADRYL) capsule 50 mg  . fluticasone (FLONASE) 50 MCG/ACT nasal spray    Sig: Place 2 sprays into both nostrils daily.    Dispense:  16 g    Refill:  0  . ibuprofen (ADVIL) 600 MG tablet    Sig: Take 1 tablet (600 mg total) by mouth every 6 (six) hours as needed.  Dispense:  30 tablet    Refill:  0  . ondansetron (ZOFRAN ODT) 8 MG disintegrating tablet    Sig: 1/2- 1 tablet q 8 hr prn nausea, vomiting    Dispense:  20 tablet    Refill:  0  . cyclobenzaprine (FLEXERIL) 10 MG tablet    Sig: Take 1 tablet (10 mg total) by mouth at bedtime.    Dispense:  20 tablet    Refill:  0  . amLODipine (NORVASC) 2.5 MG tablet    Sig: Take 1 tablet (2.5 mg total) by mouth daily.    Dispense:  15 tablet    Refill:  0    *This clinic note was created using Scientist, clinical (histocompatibility and immunogenetics). Therefore, there may be occasional mistakes despite careful proofreading.  ?    Domenick Gong, MD 07/05/20 [phone]

## 2020-07-04 NOTE — Discharge Instructions (Addendum)
For your headache/neck pain: 1000 mg of Tylenol combined with 600 mg of ibuprofen 3-4 times a day as needed,   For your TMJ: Tylenol/ibuprofen Flexeril 10 mg nightly and a soft diet for the next few days.   For your nasal congestion: Flonase, Xyzal, saline nasal irrigation with a Lloyd Huger Med rinse and distilled water as often as you want.  Keep an eye on your blood pressure.  Do not start the Norvasc if your headache goes away and your blood pressure stays relatively normal.

## 2020-07-04 NOTE — Telephone Encounter (Signed)
Patient calls nurse line stating she has had a headache for the past 4 days. Patient reports she has been taking her blood pressure at school, home, and her place of employment with all high readings. Patient reports 165/108 last night and most recent 160/100 earlier today. Patient advised to go to UC for evaluation as she has been off of her blood medicine for sometime and experiencing symptoms. Apt made for next week.

## 2020-07-05 LAB — SARS CORONAVIRUS 2 (TAT 6-24 HRS): SARS Coronavirus 2: NEGATIVE

## 2020-07-07 ENCOUNTER — Ambulatory Visit: Payer: Medicaid Other

## 2021-10-13 ENCOUNTER — Encounter: Payer: Self-pay | Admitting: *Deleted
